# Patient Record
Sex: Female | Born: 1960 | Race: White | Hispanic: No | Marital: Single | Smoking: Former smoker
Health system: Southern US, Community
[De-identification: ages and names within clinical notes are randomized; demographics above are authoritative.]

## PROBLEM LIST (undated history)

## (undated) DIAGNOSIS — Z9889 Other specified postprocedural states: Secondary | ICD-10-CM

## (undated) HISTORY — DX: Other specified postprocedural states: Z98.890

## (undated) HISTORY — PX: OTHER SURGICAL HISTORY: SHX169

---

## 2007-02-22 DIAGNOSIS — Z9889 Other specified postprocedural states: Secondary | ICD-10-CM

## 2007-02-22 HISTORY — DX: Other specified postprocedural states: Z98.890

## 2011-01-18 ENCOUNTER — Ambulatory Visit (INDEPENDENT_AMBULATORY_CARE_PROVIDER_SITE_OTHER): Payer: Managed Care, Other (non HMO) | Admitting: Family Medicine

## 2011-01-18 ENCOUNTER — Encounter: Payer: Self-pay | Admitting: Family Medicine

## 2011-01-18 VITALS — BP 108/71 | HR 89 | Wt 154.0 lb

## 2011-01-18 DIAGNOSIS — Z0000 Encounter for general adult medical examination without abnormal findings: Secondary | ICD-10-CM

## 2011-01-18 DIAGNOSIS — N943 Premenstrual tension syndrome: Secondary | ICD-10-CM

## 2011-01-18 DIAGNOSIS — I8390 Asymptomatic varicose veins of unspecified lower extremity: Secondary | ICD-10-CM

## 2011-01-18 DIAGNOSIS — Z23 Encounter for immunization: Secondary | ICD-10-CM

## 2011-01-18 DIAGNOSIS — F3281 Premenstrual dysphoric disorder: Secondary | ICD-10-CM

## 2011-01-18 DIAGNOSIS — Z1231 Encounter for screening mammogram for malignant neoplasm of breast: Secondary | ICD-10-CM

## 2011-01-18 NOTE — Progress Notes (Signed)
Subjective:    Patient ID: Jody Wheeler, female    DOB: 1960-10-07, 50 y.o.   MRN: [id number]  HPI  Here to estab care.  She recently moved to the area. She is due for her mammogram and would like [place] to schedule that for her. She is also due for Pap smear.  She is seeing Dr Guss Bunde for varicose veins.  Waiting for insurance to approve tx.   He uses sertraline prn for PMDD. Says as she is going throught menopause her need for th emedication has lessened.   Review of Systems  Constitutional: Negative for fever, diaphoresis and unexpected weight change.  HENT: Negative for hearing loss, rhinorrhea and tinnitus.   Eyes: Negative for visual disturbance.  Respiratory: Negative for cough and wheezing.   Cardiovascular: Negative for chest pain and palpitations.  Gastrointestinal: Negative for nausea, vomiting, diarrhea and blood in stool.  Genitourinary: Negative for vaginal bleeding, vaginal discharge and difficulty urinating.  Musculoskeletal: Negative for myalgias and arthralgias.  Skin: Negative for rash.  Neurological: Negative for headaches.  Hematological: Negative for adenopathy. Does not bruise/bleed easily.  Psychiatric/Behavioral: Negative for sleep disturbance and dysphoric mood. The patient is not nervous/anxious.     BP 108/71  Pulse 89  Wt 154 lb (69.854 kg)    No Known Allergies  Past Medical History  Diagnosis Date  . S/P right heart catheterization 2009    Past Surgical History  Procedure Date  . Bunion removal     History   Social History  . Marital Status: Single    Spouse Name: N/A    Number of Children: 0  . 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 Smoking status: Former Smoker    Quit date: 11/17/2005  . Smokeless tobacco: Not on file  . Alcohol Use: 2.0 oz/week    4 drink(s) per week  . Drug Use: No  . Sexually Active: Yes -- Female partner(s)   Other Topics Concern    . Not on file   Social History Narrative   No regular exercise.  3 caffeine drinks per day.     Family History  Problem Relation Age of Onset  . Lung cancer Mother     smoker  . Heart attack Father 20    deceased age 1.     Current outpatient prescriptions:sertraline (ZOLOFT) 25 MG tablet, Take 25 mg by mouth daily.  , Disp: , Rfl:       Objective:   Physical Exam  Constitutional: She is oriented to person, place, and time. She appears well-developed and well-nourished.  HENT:  Head: Normocephalic and atraumatic.  Cardiovascular: Normal rate, regular rhythm and normal heart sounds.   Pulmonary/Chest: Effort normal and breath sounds normal.  Musculoskeletal: She exhibits no edema.  Neurological: She is alert and oriented to person, place, and time.  Skin: Skin is warm and dry.  Psychiatric: She has a normal mood and affect. Her behavior is normal.          Assessment & Plan:  PMDD- doing well on as needed sertraline. We were happy to refill this for her if she needs a refill.  She plans on scheduling a physical with Pap smear in the next one to 2 months. I did go ahead and give her a lab slip to check a CMP and screening cholesterol. This can sometimes she's had this done.  We'll schedule  a mammogram for her.  Flu vaccine given today.

## 2011-01-18 NOTE — Assessment & Plan Note (Signed)
She only uses the sertraline PRN.  Having to use less fruqeuently. On average twice a month.

## 2011-01-18 NOTE — Patient Instructions (Signed)
Schedule your physical with pap smear anytime.

## 2011-01-20 LAB — LIPID PANEL
HDL: 71 mg/dL (ref >39)
Total CHOL/HDL Ratio: 3.4 Ratio
VLDL: 24 mg/dL (ref 0–40)

## 2011-01-21 LAB — COMPLETE METABOLIC PANEL WITH GFR
ALT: 16 U/L (ref 0–35)
AST: 21 U/L (ref 0–37)
Albumin: 4.5 g/dL (ref 3.5–5.2)
Alkaline Phosphatase: 44 U/L (ref 39–117)
GFR, Est Non African American: 84 mL/min
Glucose, Bld: 97 mg/dL (ref 70–99)
Potassium: 4.7 mEq/L (ref 3.5–5.3)
Sodium: 138 mEq/L (ref 135–145)
Total Bilirubin: 0.3 mg/dL (ref 0.3–1.2)
Total Protein: 7.1 g/dL (ref 6.0–8.3)

## 2011-02-23 ENCOUNTER — Encounter: Payer: Managed Care, Other (non HMO) | Admitting: Family Medicine

## 2011-03-02 ENCOUNTER — Other Ambulatory Visit (HOSPITAL_COMMUNITY)
Admission: RE | Admit: 2011-03-02 | Discharge: 2011-03-02 | Disposition: A | Discharge status: Home / Self Care | Payer: Managed Care, Other (non HMO) | Source: Ambulatory Visit | Attending: Family Medicine | Admitting: Family Medicine

## 2011-03-02 ENCOUNTER — Encounter: Payer: Self-pay | Admitting: Family Medicine

## 2011-03-02 ENCOUNTER — Ambulatory Visit (INDEPENDENT_AMBULATORY_CARE_PROVIDER_SITE_OTHER): Payer: Managed Care, Other (non HMO) | Admitting: Family Medicine

## 2011-03-02 VITALS — BP 93/59 | HR 70 | Temp 98.8°F | Ht 68.0 in | Wt 158.0 lb

## 2011-03-02 DIAGNOSIS — Z01419 Encounter for gynecological examination (general) (routine) without abnormal findings: Secondary | ICD-10-CM

## 2011-03-02 DIAGNOSIS — Z1159 Encounter for screening for other viral diseases: Secondary | ICD-10-CM | POA: Insufficient documentation

## 2011-03-02 NOTE — Patient Instructions (Signed)
Start a regular exercise program and make sure you are eating a healthy diet Try to eat 4 servings of dairy a day or take a calcium supplement (500mg twice a day). Your vaccines are up to date.   

## 2011-03-02 NOTE — Progress Notes (Signed)
Subjective:     Jody Wheeler is a 51 y.o. female and is here for a comprehensive physical exam. The patient reports no problems.  History   Social History  . Marital Status: Single    Spouse Name: N/A    Number of Children: 0  . 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 Smoking status: Former Smoker    Quit date: 11/17/2005  . Smokeless tobacco: Not on file  . Alcohol Use: 2.0 oz/week    4 drink(s) per week  . Drug Use: No  . Sexually Active: Yes -- Female partner(s)   Other Topics Concern  . Not on file   Social History Narrative   No regular exercise.  3 caffeine drinks per day.    Health Maintenance  Topic Date Due  . Influenza Vaccine  11/22/2011  . Pap Smear  03/01/2014  . Tetanus/tdap  02/22/2015    The following portions of the patient's history were reviewed and updated as appropriate: allergies, current medications, past family history, past medical history, past social history, past surgical history and problem list.  Review of Systems A comprehensive review of systems was negative.   Objective:     BP 93/59  Pulse 70  Temp(Src) 98.8 F (37.1 C) (Oral)  Ht 5\' 8"  (1.727 m)  Wt 158 lb (71.668 kg)  BMI 24.02 kg/m2  SpO2 100%  LMP 02/22/2011  General Appearance:    Alert, cooperative, no distress, appears stated age  Head:    Normocephalic, without obvious abnormality, atraumatic  Eyes:    PERRL, conjunctiva/corneas clear, EOM's intact both eyes  Ears:    Normal TM's and external ear canals, both ears  Nose:   Nares normal, septum midline, mucosa normal, no drainage    or sinus tenderness  Throat:   Lips, mucosa, and tongue normal; teeth and gums normal, small mobile right anterior cerv LN.   Neck:   Supple, symmetrical, trachea midline, no adenopathy;    thyroid:  no enlargement/tenderness/nodules; no carotid   bruit or JVD  Back:     Symmetric, no curvature, ROM normal, no CVA  tenderness  Lungs:     Clear to auscultation bilaterally, respirations unlabored  Chest Wall:    No tenderness or deformity   Heart:    Regular rate and rhythm, S1 and S2 normal, no murmur, rub   or gallop  Breast Exam:    No tenderness, masses, or nipple abnormality  Abdomen:     Soft, non-tender, bowel sounds active all four quadrants,    no masses, no organomegaly  Genitalia:    Normal female without lesion, discharge or tenderness  Rectal:    Normal tone, normal prostate, no masses or tenderness;   guaiac negative stool  Extremities:   Extremities normal, atraumatic, no cyanosis or edema  Pulses:   2+ and symmetric all extremities  Skin:   Skin color, texture, turgor normal, no rashes or lesions  Lymph nodes:   Cervical, supraclavicular, and axillary nodes normal  Neurologic:   CNII-XII intact, normal strength, sensation and reflexes    throughout      Assessment:    Healthy female exam.     Plan:     See After Visit Summary for Counseling Recommendations  Start a regular exercise program and make sure you are eating a healthy diet Try to eat 4 servings of dairy a day  or take a calcium supplement (500mg  twice a day). Your vaccines are up to date.   Right ant cervical LN. - Keep an eye on this for 1 month. If not resolving reexamine and check CBC  Will schedule mammo. We ordered it in [place] but pt was never called. She will walk down to schedule.    Will call with pap results  Given copy of labs and reviewed. She has started flax seen which has helped her chol in th epast. Recheck lipids later this month. Slip given to go to lab.

## 2011-03-08 ENCOUNTER — Ambulatory Visit
Admission: RE | Admit: 2011-03-08 | Discharge: 2011-03-08 | Disposition: A | Discharge status: Home / Self Care | Payer: Managed Care, Other (non HMO) | Source: Ambulatory Visit | Attending: Family Medicine | Admitting: Family Medicine

## 2011-03-08 DIAGNOSIS — Z1231 Encounter for screening mammogram for malignant neoplasm of breast: Secondary | ICD-10-CM

## 2011-03-14 ENCOUNTER — Telehealth: Payer: Self-pay | Admitting: *Deleted

## 2011-03-14 NOTE — Telephone Encounter (Signed)
Pt was notified about her mammogram

## 2011-03-16 ENCOUNTER — Other Ambulatory Visit: Payer: Self-pay | Admitting: Family Medicine

## 2011-03-16 DIAGNOSIS — R928 Other abnormal and inconclusive findings on diagnostic imaging of breast: Secondary | ICD-10-CM

## 2011-03-24 ENCOUNTER — Ambulatory Visit
Admission: RE | Admit: 2011-03-24 | Discharge: 2011-03-24 | Disposition: A | Discharge status: Home / Self Care | Payer: Managed Care, Other (non HMO) | Source: Ambulatory Visit | Attending: Family Medicine | Admitting: Family Medicine

## 2011-03-24 ENCOUNTER — Other Ambulatory Visit: Payer: Self-pay | Admitting: Family Medicine

## 2011-03-24 DIAGNOSIS — R928 Other abnormal and inconclusive findings on diagnostic imaging of breast: Secondary | ICD-10-CM

## 2011-03-25 ENCOUNTER — Other Ambulatory Visit (HOSPITAL_COMMUNITY): Payer: Self-pay

## 2011-04-13 LAB — LIPID PANEL
Cholesterol: 206 mg/dL — ABNORMAL HIGH (ref 0–200)
LDL Cholesterol: 115 mg/dL — ABNORMAL HIGH (ref 0–99)
Triglycerides: 140 mg/dL (ref <150)
VLDL: 28 mg/dL (ref 0–40)

## 2011-04-26 ENCOUNTER — Ambulatory Visit (INDEPENDENT_AMBULATORY_CARE_PROVIDER_SITE_OTHER): Payer: Managed Care, Other (non HMO) | Admitting: Family Medicine

## 2011-04-26 ENCOUNTER — Encounter: Payer: Self-pay | Admitting: Family Medicine

## 2011-04-26 DIAGNOSIS — K1370 Unspecified lesions of oral mucosa: Secondary | ICD-10-CM

## 2011-04-26 NOTE — Progress Notes (Signed)
  Subjective:    Patient ID: Jody Wheeler, female    DOB: 28-Apr-1960, 51 y.o.   MRN: [id number]  HPI Noticed a blister on the roof of the mouth. No knownn trauma or burns.  No fever. Will occ burst and drain fluids. No very panful.  Today it seem smaller.  No treatments. Says when it drains not sure what it looks like but tastes really bad.  No ST or dental problems. Goes to the dentis twice a year. Last appt was 4 months ago.     Review of Systems     Objective:   Physical Exam  She has a small 0.3 cm white colored vesicle with clear fluid. No surroundin erythema. Nontender.  Teeth  Appear in good shape.       Assessment & Plan:  Bister on the roof of mouth. Vesicle- Gave reassurance. May be burn or trauma induced.  Peroxide/water gargles. Call if not better in one week or if getting worse, fever, or colored drainage.

## 2011-04-26 NOTE — Patient Instructions (Signed)
Hydrogen peroxide and water gargles 4 times a day.  Call if not better in one week.

## 2011-11-25 ENCOUNTER — Ambulatory Visit (INDEPENDENT_AMBULATORY_CARE_PROVIDER_SITE_OTHER): Payer: Managed Care, Other (non HMO) | Admitting: Family Medicine

## 2011-11-25 ENCOUNTER — Encounter: Payer: Self-pay | Admitting: Family Medicine

## 2011-11-25 VITALS — BP 132/85 | HR 72 | Wt 155.0 lb

## 2011-11-25 DIAGNOSIS — N943 Premenstrual tension syndrome: Secondary | ICD-10-CM

## 2011-11-25 DIAGNOSIS — F3281 Premenstrual dysphoric disorder: Secondary | ICD-10-CM

## 2011-11-25 DIAGNOSIS — Z23 Encounter for immunization: Secondary | ICD-10-CM

## 2011-11-25 MED ORDER — SERTRALINE HCL 25 MG PO TABS: 25.0000 mg | ORAL_TABLET | Freq: Every day | ORAL | Status: DC

## 2011-11-25 NOTE — Progress Notes (Signed)
  Subjective:    Patient ID: Jody Wheeler, female    DOB: 15-Aug-1960, 51 y.o.   MRN: [id number]  HPI PMDD - Says cam off the sertraline months ago and now noticing she is irritable and snappy. Still having her period.  She is lseeping well.  She is aking to restart it. No S.E. Denies depression. Sleeping well.    Review of Systems     Objective:   Physical Exam  Constitutional: She is oriented to person, place, and time. She appears well-developed and well-nourished.  HENT:  Head: Normocephalic and atraumatic.  Neck: Neck supple. No thyromegaly present.  Cardiovascular: Normal rate, regular rhythm and normal heart sounds.   Pulmonary/Chest: Effort normal and breath sounds normal.  Lymphadenopathy:    She has no cervical adenopathy.  Neurological: She is alert and oriented to person, place, and time.  Skin: Skin is warm and dry.  Psychiatric: She has a normal mood and affect. Her behavior is normal.          Assessment & Plan:  PMDD- Will restart the sertraline. New prescriptions sent to pharmacy. I think she will do well. She gram-positive before. Call if not working well.  Recommend followup for annual wellness exam in January February next year. She'll be due for for blood work at that time.  Flu vaccine given today.

## 2012-10-20 ENCOUNTER — Other Ambulatory Visit: Payer: Self-pay | Admitting: Family Medicine

## 2013-06-14 ENCOUNTER — Encounter: Payer: Self-pay | Admitting: Family Medicine

## 2013-06-14 ENCOUNTER — Ambulatory Visit (INDEPENDENT_AMBULATORY_CARE_PROVIDER_SITE_OTHER): Payer: BC Managed Care – PPO | Admitting: Family Medicine

## 2013-06-14 VITALS — BP 102/67 | HR 79 | Ht 68.0 in | Wt 161.0 lb

## 2013-06-14 DIAGNOSIS — Z1231 Encounter for screening mammogram for malignant neoplasm of breast: Secondary | ICD-10-CM

## 2013-06-14 DIAGNOSIS — Z0000 Encounter for general adult medical examination without abnormal findings: Secondary | ICD-10-CM

## 2013-06-14 NOTE — Addendum Note (Signed)
Addended by: Deno EtienneBARKLEY, Slaton Reaser L on: 06/14/2013 11:48 AM   Modules accepted: Orders, Medications

## 2013-06-14 NOTE — Progress Notes (Signed)
Subjective:     Jody Wheeler is a [age] y.o. female and is here for a comprehensive physical exam. The patient reports no problems.  Her biggest concern right now is hot flashes. Previously she was able to use black cohosh and soy to control her symptoms. Her last menstrual cycle was in December. Since then her symptoms have been more severe. Hasn't really been affecting her sleep which is good. She wants to know if it's okay to take estroven over-the-counter and also I discussed potentially hormone replacement therapy.  History   Social History  . Marital Status: Single    Spouse Name: N/A    Number of Children: 0  . Years of Education: Assoc deg   Occupational History  . 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 Smoking status: Former Smoker    Quit date: 11/17/2005  . Smokeless tobacco: Not on file  . Alcohol Use: 2.0 oz/week    4 drink(s) per week  . Drug Use: No  . Sexual Activity: Yes    Partners: Male   Other Topics Concern  . Not on file   Social History Narrative   No regular exercise.  3 caffeine drinks per day.    Health Maintenance  Topic Date Due  . Mammogram  05/15/2011  . Colonoscopy  05/15/2011  . Influenza Vaccine  09/21/2013  . Pap Smear  03/01/2014  . Tetanus/tdap  02/22/2015    The following portions of the patient's history were reviewed and updated as appropriate: allergies, current medications, past family history, past medical history, past social history, past surgical history and problem list.  Review of Systems A comprehensive review of systems was negative.   Objective:    There were no vitals taken for this visit. General appearance: alert, cooperative and appears stated age Head: Normocephalic, without obvious abnormality, atraumatic Eyes: conj clear, EOMI, PEERLA Ears: normal TM's and external ear canals both ears Nose: Nares normal. Septum midline. Mucosa normal. No drainage or sinus tenderness. Throat: lips, mucosa, and  tongue normal; teeth and gums normal Neck: no adenopathy, no carotid bruit, no JVD, supple, symmetrical, trachea midline and thyroid not enlarged, symmetric, no tenderness/mass/nodules Back: symmetric, no curvature. ROM normal. No CVA tenderness. Lungs: clear to auscultation bilaterally Breasts: normal appearance, no masses or tenderness Heart: regular rate and rhythm, S1, S2 normal, no murmur, click, rub or gallop Abdomen: soft, non-tender; bowel sounds normal; no masses,  no organomegaly Extremities: extremities normal, atraumatic, no cyanosis or edema Pulses: 2+ and symmetric Skin: Skin color, texture, turgor normal. No rashes or lesions Lymph nodes: Cervical, supraclavicular, and axillary nodes normal. Neurologic: Alert and oriented X 3, normal strength and tone. Normal symmetric reflexes. Normal coordination and gait     Assessment:    Healthy female exam.    Plan:     See After Visit Summary for Counseling Recommendations  complete physical examination Keep up a regular exercise program and make sure you are eating a healthy diet Try to eat 4 servings of dairy a day, or if you are lactose intolerant take a calcium with vitamin D daily.  Your vaccines are up to date.  Due for mammogram.   Dicussed need for colon Cancer screening. Prefers to go ahead and schedule in [place] since she's had a followup ultrasound the last couple of times. Pap smear is UTD.   Hot flashes-okay to use black cohosh and may even want to try adding evening primrose oil. Call  if any problems or concerns. We did discuss the pros and cons of hormone replacement therapy. Certainly she can think about it and let me know what she would like to do. We did discuss increased risk of heart disease as well as breast cancer. She has no family history of breast cancer or personal history.

## 2013-06-21 LAB — LIPID PANEL
CHOL/HDL RATIO: 2.4 ratio
CHOLESTEROL: 159 mg/dL (ref 0–200)
HDL: 65 mg/dL (ref >39)
LDL Cholesterol: 73 mg/dL (ref 0–99)
TRIGLYCERIDES: 107 mg/dL (ref <150)
VLDL: 21 mg/dL (ref 0–40)

## 2013-06-21 LAB — COMPLETE METABOLIC PANEL WITH GFR
ALK PHOS: 52 U/L (ref 39–117)
ALT: 19 U/L (ref 0–35)
AST: 25 U/L (ref 0–37)
Albumin: 4 g/dL (ref 3.5–5.2)
BUN: 16 mg/dL (ref 6–23)
CALCIUM: 8.7 mg/dL (ref 8.4–10.5)
CHLORIDE: 105 meq/L (ref 96–112)
CO2: 24 mEq/L (ref 19–32)
CREATININE: 0.7 mg/dL (ref 0.50–1.10)
GFR, Est African American: >89 mL/min
GFR, Est Non African American: >89 mL/min
Glucose, Bld: 104 mg/dL — ABNORMAL HIGH (ref 70–99)
Potassium: 4.6 mEq/L (ref 3.5–5.3)
Sodium: 139 mEq/L (ref 135–145)
Total Bilirubin: 0.4 mg/dL (ref 0.2–1.2)
Total Protein: 6.3 g/dL (ref 6.0–8.3)

## 2013-06-24 NOTE — Progress Notes (Signed)
Quick Note:  All labs are normal. ______ 

## 2013-10-08 ENCOUNTER — Emergency Department
Admission: EM | Admit: 2013-10-08 | Discharge: 2013-10-08 | Disposition: A | Discharge status: Home / Self Care | Payer: BC Managed Care – PPO | Source: Home / Self Care

## 2013-10-08 ENCOUNTER — Emergency Department (INDEPENDENT_AMBULATORY_CARE_PROVIDER_SITE_OTHER): Payer: BC Managed Care – PPO

## 2013-10-08 ENCOUNTER — Encounter: Payer: Self-pay | Admitting: Emergency Medicine

## 2013-10-08 DIAGNOSIS — M549 Dorsalgia, unspecified: Secondary | ICD-10-CM

## 2013-10-08 DIAGNOSIS — S239XXA Sprain of unspecified parts of thorax, initial encounter: Secondary | ICD-10-CM

## 2013-10-08 DIAGNOSIS — S139XXA Sprain of joints and ligaments of unspecified parts of neck, initial encounter: Secondary | ICD-10-CM

## 2013-10-08 DIAGNOSIS — S335XXA Sprain of ligaments of lumbar spine, initial encounter: Secondary | ICD-10-CM

## 2013-10-08 MED ORDER — KETOROLAC TROMETHAMINE 60 MG/2ML IM SOLN
60.0000 mg | Freq: Once | INTRAMUSCULAR | Status: AC
  Administered 2013-10-08: 60 mg via INTRAMUSCULAR

## 2013-10-08 MED ORDER — CYCLOBENZAPRINE HCL 10 MG PO TABS: ORAL_TABLET | ORAL | Status: DC
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
## 2013-10-08 NOTE — Discharge Instructions (Signed)
Ibuprofen 800mg  up to three times a day.  Flexeril as needed up to 3 times a day.  Alternate heat and ice.  Rest for next 3 days.   Low Back Sprain with Rehab  A sprain is an injury in which a ligament is torn. The ligaments of the lower back are vulnerable to sprains. However, they are strong and require great force to be injured. These ligaments are important for stabilizing the spinal column. Sprains are classified into three categories. Grade 1 sprains cause pain, but the tendon is not lengthened. Grade 2 sprains include a lengthened ligament, due to the ligament being stretched or partially ruptured. With grade 2 sprains there is still function, although the function may be decreased. Grade 3 sprains involve a complete tear of the tendon or muscle, and function is usually impaired. SYMPTOMS   Severe pain in the lower back.  Sometimes, a feeling of a "pop," "snap," or tear, at the time of injury.  Tenderness and sometimes swelling at the injury site.  Uncommonly, bruising (contusion) within 48 hours of injury.  Muscle spasms in the back. CAUSES  Low back sprains occur when a force is placed on the ligaments that is greater than they can handle. Common causes of injury include:  Performing a stressful act while off-balance.  Repetitive stressful activities that involve movement of the lower back.  Direct hit (trauma) to the lower back. RISK INCREASES WITH:  Contact sports (football, wrestling).  Collisions (major skiing accidents).  Sports that require throwing or lifting (baseball, weightlifting).  Sports involving twisting of the spine (gymnastics, diving, tennis, golf).  Poor strength and flexibility.  Inadequate protection.  Previous back injury or surgery (especially fusion). PREVENTION  Wear properly fitted and padded protective equipment.  Warm up and stretch properly before activity.  Allow for adequate recovery between workouts.  Maintain physical  fitness:  Strength, flexibility, and endurance.  Cardiovascular fitness.  Maintain a healthy body weight. PROGNOSIS  If treated properly, low back sprains usually heal with non-surgical treatment. The length of time for healing depends on the severity of the injury.  RELATED COMPLICATIONS   Recurring symptoms, resulting in a chronic problem.  Chronic inflammation and pain in the low back.  Delayed healing or resolution of symptoms, especially if activity is resumed too soon.  Prolonged impairment.  Unstable or arthritic joints of the low back. TREATMENT  Treatment first involves the use of ice and medicine, to reduce pain and inflammation. The use of strengthening and stretching exercises may help reduce pain with activity. These exercises may be performed at home or with a therapist. Severe injuries may require referral to a therapist for further evaluation and treatment, such as ultrasound. Your caregiver may advise that you wear a back brace or corset, to help reduce pain and discomfort. Often, prolonged bed rest results in greater harm then benefit. Corticosteroid injections may be recommended. However, these should be reserved for the most serious cases. It is important to avoid using your back when lifting objects. At night, sleep on your back on a firm mattress, with a pillow placed under your knees. If non-surgical treatment is unsuccessful, surgery may be needed.  MEDICATION   If pain medicine is needed, nonsteroidal anti-inflammatory medicines (aspirin and ibuprofen), or other minor pain relievers (acetaminophen), are often advised.  Do not take pain medicine for 7 days before surgery.  Prescription pain relievers may be given, if your caregiver thinks they are needed. Use only as directed and only as much  as you need.  Ointments applied to the skin may be helpful.  Corticosteroid injections may be given by your caregiver. These injections should be reserved for the most  serious cases, because they may only be given a certain number of times. HEAT AND COLD  Cold treatment (icing) should be applied for 10 to 15 minutes every 2 to 3 hours for inflammation and pain, and immediately after activity that aggravates your symptoms. Use ice packs or an ice massage.  Heat treatment may be used before performing stretching and strengthening activities prescribed by your caregiver, physical therapist, or athletic trainer. Use a heat pack or a warm water soak. SEEK MEDICAL CARE IF:   Symptoms get worse or do not improve in 2 to 4 weeks, despite treatment.  You develop numbness or weakness in either leg.  You lose bowel or bladder function.  Any of the following occur after surgery: fever, increased pain, swelling, redness, drainage of fluids, or bleeding in the affected area.  New, unexplained symptoms develop. (Drugs used in treatment may produce side effects.) EXERCISES  RANGE OF MOTION (ROM) AND STRETCHING EXERCISES - Low Back Sprain Most people with lower back pain will find that their symptoms get worse with excessive bending forward (flexion) or arching at the lower back (extension). The exercises that will help resolve your symptoms will focus on the opposite motion.  Your physician, physical therapist or athletic trainer will help you determine which exercises will be most helpful to resolve your lower back pain. Do not complete any exercises without first consulting with your caregiver. Discontinue any exercises which make your symptoms worse, until you speak to your caregiver. If you have pain, numbness or tingling which travels down into your buttocks, leg or foot, the goal of the therapy is for these symptoms to move closer to your back and eventually resolve. Sometimes, these leg symptoms will get better, but your lower back pain may worsen. This is often an indication of progress in your rehabilitation. Be very alert to any changes in your symptoms and the  activities in which you participated in the 24 hours prior to the change. Sharing this information with your caregiver will allow him or her to most efficiently treat your condition. These exercises may help you when beginning to rehabilitate your injury. Your symptoms may resolve with or without further involvement from your physician, physical therapist or athletic trainer. While completing these exercises, remember:   Restoring tissue flexibility helps normal motion to return to the joints. This allows healthier, less painful movement and activity.  An effective stretch should be held for at least 30 seconds.  A stretch should never be painful. You should only feel a gentle lengthening or release in the stretched tissue. FLEXION RANGE OF MOTION AND STRETCHING EXERCISES: STRETCH - Flexion, Single Knee to Chest   Lie on a firm bed or floor with both legs extended in front of you.  Keeping one leg in contact with the floor, bring your opposite knee to your chest. Hold your leg in place by either grabbing behind your thigh or at your knee.  Pull until you feel a gentle stretch in your low back. Hold __________ seconds.  Slowly release your grasp and repeat the exercise with the opposite side. Repeat __________ times. Complete this exercise __________ times per day.  STRETCH - Flexion, Double Knee to Chest  Lie on a firm bed or floor with both legs extended in front of you.  Keeping one leg in contact  with the floor, bring your opposite knee to your chest.  Tense your stomach muscles to support your back and then lift your other knee to your chest. Hold your legs in place by either grabbing behind your thighs or at your knees.  Pull both knees toward your chest until you feel a gentle stretch in your low back. Hold __________ seconds.  Tense your stomach muscles and slowly return one leg at a time to the floor. Repeat __________ times. Complete this exercise __________ times per day.    STRETCH - Low Trunk Rotation  Lie on a firm bed or floor. Keeping your legs in front of you, bend your knees so they are both pointed toward the ceiling and your feet are flat on the floor.  Extend your arms out to the side. This will stabilize your upper body by keeping your shoulders in contact with the floor.  Gently and slowly drop both knees together to one side until you feel a gentle stretch in your low back. Hold for __________ seconds.  Tense your stomach muscles to support your lower back as you bring your knees back to the starting position. Repeat the exercise to the other side. Repeat __________ times. Complete this exercise __________ times per day  EXTENSION RANGE OF MOTION AND FLEXIBILITY EXERCISES: STRETCH - Extension, Prone on Elbows   Lie on your stomach on the floor, a bed will be too soft. Place your palms about shoulder width apart and at the height of your head.  Place your elbows under your shoulders. If this is too painful, stack pillows under your chest.  Allow your body to relax so that your hips drop lower and make contact more completely with the floor.  Hold this position for __________ seconds.  Slowly return to lying flat on the floor. Repeat __________ times. Complete this exercise __________ times per day.  RANGE OF MOTION - Extension, Prone Press Ups  Lie on your stomach on the floor, a bed will be too soft. Place your palms about shoulder width apart and at the height of your head.  Keeping your back as relaxed as possible, slowly straighten your elbows while keeping your hips on the floor. You may adjust the placement of your hands to maximize your comfort. As you gain motion, your hands will come more underneath your shoulders.  Hold this position __________ seconds.  Slowly return to lying flat on the floor. Repeat __________ times. Complete this exercise __________ times per day.  RANGE OF MOTION- Quadruped, Neutral Spine   Assume a hands  and knees position on a firm surface. Keep your hands under your shoulders and your knees under your hips. You may place padding under your knees for comfort.  Drop your head and point your tailbone toward the ground below you. This will round out your lower back like an angry cat. Hold this position for __________ seconds.  Slowly lift your head and release your tail bone so that your back sags into a large arch, like an old horse.  Hold this position for __________ seconds.  Repeat this until you feel limber in your low back.  Now, find your "sweet spot." This will be the most comfortable position somewhere between the two previous positions. This is your neutral spine. Once you have found this position, tense your stomach muscles to support your low back.  Hold this position for __________ seconds. Repeat __________ times. Complete this exercise __________ times per day.  STRENGTHENING EXERCISES - Low  Back Sprain These exercises may help you when beginning to rehabilitate your injury. These exercises should be done near your "sweet spot." This is the neutral, low-back arch, somewhere between fully rounded and fully arched, that is your least painful position. When performed in this safe range of motion, these exercises can be used for people who have either a flexion or extension based injury. These exercises may resolve your symptoms with or without further involvement from your physician, physical therapist or athletic trainer. While completing these exercises, remember:   Muscles can gain both the endurance and the strength needed for everyday activities through controlled exercises.  Complete these exercises as instructed by your physician, physical therapist or athletic trainer. Increase the resistance and repetitions only as guided.  You may experience muscle soreness or fatigue, but the pain or discomfort you are trying to eliminate should never worsen during these exercises. If this  pain does worsen, stop and make certain you are following the directions exactly. If the pain is still present after adjustments, discontinue the exercise until you can discuss the trouble with your caregiver. STRENGTHENING - Deep Abdominals, Pelvic Tilt   Lie on a firm bed or floor. Keeping your legs in front of you, bend your knees so they are both pointed toward the ceiling and your feet are flat on the floor.  Tense your lower abdominal muscles to press your low back into the floor. This motion will rotate your pelvis so that your tail bone is scooping upwards rather than pointing at your feet or into the floor. With a gentle tension and even breathing, hold this position for __________ seconds. Repeat __________ times. Complete this exercise __________ times per day.  STRENGTHENING - Abdominals, Crunches   Lie on a firm bed or floor. Keeping your legs in front of you, bend your knees so they are both pointed toward the ceiling and your feet are flat on the floor. Cross your arms over your chest.  Slightly tip your chin down without bending your neck.  Tense your abdominals and slowly lift your trunk high enough to just clear your shoulder blades. Lifting higher can put excessive stress on the lower back and does not further strengthen your abdominal muscles.  Control your return to the starting position. Repeat __________ times. Complete this exercise __________ times per day.  STRENGTHENING - Quadruped, Opposite UE/LE Lift   Assume a hands and knees position on a firm surface. Keep your hands under your shoulders and your knees under your hips. You may place padding under your knees for comfort.  Find your neutral spine and gently tense your abdominal muscles so that you can maintain this position. Your shoulders and hips should form a rectangle that is parallel with the floor and is not twisted.  Keeping your trunk steady, lift your right hand no higher than your shoulder and then your  left leg no higher than your hip. Make sure you are not holding your breath. Hold this position for __________ seconds.  Continuing to keep your abdominal muscles tense and your back steady, slowly return to your starting position. Repeat with the opposite arm and leg. Repeat __________ times. Complete this exercise __________ times per day.  STRENGTHENING - Abdominals and Quadriceps, Straight Leg Raise   Lie on a firm bed or floor with both legs extended in front of you.  Keeping one leg in contact with the floor, bend the other knee so that your foot can rest flat on the floor.  Find your neutral spine, and tense your abdominal muscles to maintain your spinal position throughout the exercise.  Slowly lift your straight leg off the floor about 6 inches for a count of 15, making sure to not hold your breath.  Still keeping your neutral spine, slowly lower your leg all the way to the floor. Repeat this exercise with each leg __________ times. Complete this exercise __________ times per day. POSTURE AND BODY MECHANICS CONSIDERATIONS - Low Back Sprain Keeping correct posture when sitting, standing or completing your activities will reduce the stress put on different body tissues, allowing injured tissues a chance to heal and limiting painful experiences. The following are general guidelines for improved posture. Your physician or physical therapist will provide you with any instructions specific to your needs. While reading these guidelines, remember:  The exercises prescribed by your provider will help you have the flexibility and strength to maintain correct postures.  The correct posture provides the best environment for your joints to work. All of your joints have less wear and tear when properly supported by a spine with good posture. This means you will experience a healthier, less painful body.  Correct posture must be practiced with all of your activities, especially prolonged sitting and  standing. Correct posture is as important when doing repetitive low-stress activities (typing) as it is when doing a single heavy-load activity (lifting). RESTING POSITIONS Consider which positions are most painful for you when choosing a resting position. If you have pain with flexion-based activities (sitting, bending, stooping, squatting), choose a position that allows you to rest in a less flexed posture. You would want to avoid curling into a fetal position on your side. If your pain worsens with extension-based activities (prolonged standing, working overhead), avoid resting in an extended position such as sleeping on your stomach. Most people will find more comfort when they rest with their spine in a more neutral position, neither too rounded nor too arched. Lying on a non-sagging bed on your side with a pillow between your knees, or on your back with a pillow under your knees will often provide some relief. Keep in mind, being in any one position for a prolonged period of time, no matter how correct your posture, can still lead to stiffness. PROPER SITTING POSTURE In order to minimize stress and discomfort on your spine, you must sit with correct posture. Sitting with good posture should be effortless for a healthy body. Returning to good posture is a gradual process. Many people can work toward this most comfortably by using various supports until they have the flexibility and strength to maintain this posture on their own. When sitting with proper posture, your ears will fall over your shoulders and your shoulders will fall over your hips. You should use the back of the chair to support your upper back. Your lower back will be in a neutral position, just slightly arched. You may place a small pillow or folded towel at the base of your lower back for  support.  When working at a desk, create an environment that supports good, upright posture. Without extra support, muscles tire, which leads to  excessive strain on joints and other tissues. Keep these recommendations in mind: CHAIR:  A chair should be able to slide under your desk when your back makes contact with the back of the chair. This allows you to work closely.  The chair's height should allow your eyes to be level with the upper part of your monitor and  your hands to be slightly lower than your elbows. BODY POSITION  Your feet should make contact with the floor. If this is not possible, use a foot rest.  Keep your ears over your shoulders. This will reduce stress on your neck and low back. INCORRECT SITTING POSTURES  If you are feeling tired and unable to assume a healthy sitting posture, do not slouch or slump. This puts excessive strain on your back tissues, causing more damage and pain. Healthier options include:  Using more support, like a lumbar pillow.  Switching tasks to something that requires you to be upright or walking.  Talking a brief walk.  Lying down to rest in a neutral-spine position. PROLONGED STANDING WHILE SLIGHTLY LEANING FORWARD  When completing a task that requires you to lean forward while standing in one place for a long time, place either foot up on a stationary 2-4 inch high object to help maintain the best posture. When both feet are on the ground, the lower back tends to lose its slight inward curve. If this curve flattens (or becomes too large), then the back and your other joints will experience too much stress, tire more quickly, and can cause pain. CORRECT STANDING POSTURES Proper standing posture should be assumed with all daily activities, even if they only take a few moments, like when brushing your teeth. As in sitting, your ears should fall over your shoulders and your shoulders should fall over your hips. You should keep a slight tension in your abdominal muscles to brace your spine. Your tailbone should point down to the ground, not behind your body, resulting in an over-extended  swayback posture.  INCORRECT STANDING POSTURES  Common incorrect standing postures include a forward head, locked knees and/or an excessive swayback. WALKING Walk with an upright posture. Your ears, shoulders and hips should all line-up. PROLONGED ACTIVITY IN A FLEXED POSITION When completing a task that requires you to bend forward at your waist or lean over a low surface, try to find a way to stabilize 3 out of 4 of your limbs. You can place a hand or elbow on your thigh or rest a knee on the surface you are reaching across. This will provide you more stability, so that your muscles do not tire as quickly. By keeping your knees relaxed, or slightly bent, you will also reduce stress across your lower back. CORRECT LIFTING TECHNIQUES DO :  Assume a wide stance. This will provide you more stability and the opportunity to get as close as possible to the object which you are lifting.  Tense your abdominals to brace your spine. Bend at the knees and hips. Keeping your back locked in a neutral-spine position, lift using your leg muscles. Lift with your legs, keeping your back straight.  Test the weight of unknown objects before attempting to lift them.  Try to keep your elbows locked down at your sides in order get the best strength from your shoulders when carrying an object.  Always ask for help when lifting heavy or awkward objects. INCORRECT LIFTING TECHNIQUES DO NOT:   Lock your knees when lifting, even if it is a small object.  Bend and twist. Pivot at your feet or move your feet when needing to change directions.  Assume that you can safely pick up even a paperclip without proper posture. Document Released: 02/07/2005 Document Revised: 05/02/2011 Document Reviewed: 05/22/2008 [facility] Patient Information 2015 [place], [place]. This information is not intended to replace advice given to you by your  health care provider. Make sure you discuss any questions you have with your health care  provider. Cervical Strain and Sprain (Whiplash) with Rehab Cervical strain and sprain are injuries that commonly occur with "whiplash" injuries. Whiplash occurs when the neck is forcefully whipped backward or forward, such as during a motor vehicle accident or during contact sports. The muscles, ligaments, tendons, discs, and nerves of the neck are susceptible to injury when this occurs. RISK FACTORS Risk of having a whiplash injury increases if:  Osteoarthritis of the spine.  Situations that make head or neck accidents or trauma more likely.  High-risk sports (football, rugby, wrestling, hockey, auto racing, gymnastics, diving, contact karate, or boxing).  Poor strength and flexibility of the neck.  Previous neck injury.  Poor tackling technique.  Improperly fitted or padded equipment. SYMPTOMS   Pain or stiffness in the front or back of neck or both.  Symptoms may present immediately or up to 24 hours after injury.  Dizziness, headache, nausea, and vomiting.  Muscle spasm with soreness and stiffness in the neck.  Tenderness and swelling at the injury site. PREVENTION  Learn and use proper technique (avoid tackling with the head, spearing, and head-butting; use proper falling techniques to avoid landing on the head).  Warm up and stretch properly before activity.  Maintain physical fitness:  Strength, flexibility, and endurance.  Cardiovascular fitness.  Wear properly fitted and padded protective equipment, such as padded soft collars, for participation in contact sports. PROGNOSIS  Recovery from cervical strain and sprain injuries is dependent on the extent of the injury. These injuries are usually curable in 1 week to 3 months with appropriate treatment.  RELATED COMPLICATIONS   Temporary numbness and weakness may occur if the nerve roots are damaged, and this may persist until the nerve has completely healed.  Chronic pain due to frequent recurrence of  symptoms.  Prolonged healing, especially if activity is resumed too soon (before complete recovery). TREATMENT  Treatment initially involves the use of ice and medication to help reduce pain and inflammation. It is also important to perform strengthening and stretching exercises and modify activities that worsen symptoms so the injury does not get worse. These exercises may be performed at home or with a therapist. For patients who experience severe symptoms, a soft, padded collar may be recommended to be worn around the neck.  Improving your posture may help reduce symptoms. Posture improvement includes pulling your chin and abdomen in while sitting or standing. If you are sitting, sit in a firm chair with your buttocks against the back of the chair. While sleeping, try replacing your pillow with a small towel rolled to 2 inches in diameter, or use a cervical pillow or soft cervical collar. Poor sleeping positions delay healing.  For patients with nerve root damage, which causes numbness or weakness, the use of a cervical traction apparatus may be recommended. Surgery is rarely necessary for these injuries. However, cervical strain and sprains that are present at birth (congenital) may require surgery. MEDICATION   If pain medication is necessary, nonsteroidal anti-inflammatory medications, such as aspirin and ibuprofen, or other minor pain relievers, such as acetaminophen, are often recommended.  Do not take pain medication for 7 days before surgery.  Prescription pain relievers may be given if deemed necessary by your caregiver. Use only as directed and only as much as you need. HEAT AND COLD:   Cold treatment (icing) relieves pain and reduces inflammation. Cold treatment should be applied for 10 to 15  minutes every 2 to 3 hours for inflammation and pain and immediately after any activity that aggravates your symptoms. Use ice packs or an ice massage.  Heat treatment may be used prior to  performing the stretching and strengthening activities prescribed by your caregiver, physical therapist, or athletic trainer. Use a heat pack or a warm soak. SEEK MEDICAL CARE IF:   Symptoms get worse or do not improve in 2 weeks despite treatment.  New, unexplained symptoms develop (drugs used in treatment may produce side effects). EXERCISES RANGE OF MOTION (ROM) AND STRETCHING EXERCISES - Cervical Strain and Sprain These exercises may help you when beginning to rehabilitate your injury. In order to successfully resolve your symptoms, you must improve your posture. These exercises are designed to help reduce the forward-head and rounded-shoulder posture which contributes to this condition. Your symptoms may resolve with or without further involvement from your physician, physical therapist or athletic trainer. While completing these exercises, remember:   Restoring tissue flexibility helps normal motion to return to the joints. This allows healthier, less painful movement and activity.  An effective stretch should be held for at least 20 seconds, although you may need to begin with shorter hold times for comfort.  A stretch should never be painful. You should only feel a gentle lengthening or release in the stretched tissue. STRETCH- Axial Extensors  Lie on your back on the floor. You may bend your knees for comfort. Place a rolled-up hand towel or dish towel, about 2 inches in diameter, under the part of your head that makes contact with the floor.  Gently tuck your chin, as if trying to make a "double chin," until you feel a gentle stretch at the base of your head.  Hold __________ seconds. Repeat __________ times. Complete this exercise __________ times per day.  STRETCH - Axial Extension   Stand or sit on a firm surface. Assume a good posture: chest up, shoulders drawn back, abdominal muscles slightly tense, knees unlocked (if standing) and feet hip width apart.  Slowly retract your  chin so your head slides back and your chin slightly lowers. Continue to look straight ahead.  You should feel a gentle stretch in the back of your head. Be certain not to feel an aggressive stretch since this can cause headaches later.  Hold for __________ seconds. Repeat __________ times. Complete this exercise __________ times per day. STRETCH - Cervical Side Bend   Stand or sit on a firm surface. Assume a good posture: chest up, shoulders drawn back, abdominal muscles slightly tense, knees unlocked (if standing) and feet hip width apart.  Without letting your nose or shoulders move, slowly tip your right / left ear to your shoulder until your feel a gentle stretch in the muscles on the opposite side of your neck.  Hold __________ seconds. Repeat __________ times. Complete this exercise __________ times per day. STRETCH - Cervical Rotators   Stand or sit on a firm surface. Assume a good posture: chest up, shoulders drawn back, abdominal muscles slightly tense, knees unlocked (if standing) and feet hip width apart.  Keeping your eyes level with the ground, slowly turn your head until you feel a gentle stretch along the back and opposite side of your neck.  Hold __________ seconds. Repeat __________ times. Complete this exercise __________ times per day. RANGE OF MOTION - Neck Circles   Stand or sit on a firm surface. Assume a good posture: chest up, shoulders drawn back, abdominal muscles slightly tense, knees unlocked (  if standing) and feet hip width apart.  Gently roll your head down and around from the back of one shoulder to the back of the other. The motion should never be forced or painful.  Repeat the motion 10-20 times, or until you feel the neck muscles relax and loosen. Repeat __________ times. Complete the exercise __________ times per day. STRENGTHENING EXERCISES - Cervical Strain and Sprain These exercises may help you when beginning to rehabilitate your injury. They may  resolve your symptoms with or without further involvement from your physician, physical therapist, or athletic trainer. While completing these exercises, remember:   Muscles can gain both the endurance and the strength needed for everyday activities through controlled exercises.  Complete these exercises as instructed by your physician, physical therapist, or athletic trainer. Progress the resistance and repetitions only as guided.  You may experience muscle soreness or fatigue, but the pain or discomfort you are trying to eliminate should never worsen during these exercises. If this pain does worsen, stop and make certain you are following the directions exactly. If the pain is still present after adjustments, discontinue the exercise until you can discuss the trouble with your clinician. STRENGTH - Cervical Flexors, Isometric  Face a wall, standing about 6 inches away. Place a small pillow, a ball about 6-8 inches in diameter, or a folded towel between your forehead and the wall.  Slightly tuck your chin and gently push your forehead into the soft object. Push only with mild to moderate intensity, building up tension gradually. Keep your jaw and forehead relaxed.  Hold 10 to 20 seconds. Keep your breathing relaxed.  Release the tension slowly. Relax your neck muscles completely before you start the next repetition. Repeat __________ times. Complete this exercise __________ times per day. STRENGTH- Cervical Lateral Flexors, Isometric   Stand about 6 inches away from a wall. Place a small pillow, a ball about 6-8 inches in diameter, or a folded towel between the side of your head and the wall.  Slightly tuck your chin and gently tilt your head into the soft object. Push only with mild to moderate intensity, building up tension gradually. Keep your jaw and forehead relaxed.  Hold 10 to 20 seconds. Keep your breathing relaxed.  Release the tension slowly. Relax your neck muscles completely  before you start the next repetition. Repeat __________ times. Complete this exercise __________ times per day. STRENGTH - Cervical Extensors, Isometric   Stand about 6 inches away from a wall. Place a small pillow, a ball about 6-8 inches in diameter, or a folded towel between the back of your head and the wall.  Slightly tuck your chin and gently tilt your head back into the soft object. Push only with mild to moderate intensity, building up tension gradually. Keep your jaw and forehead relaxed.  Hold 10 to 20 seconds. Keep your breathing relaxed.  Release the tension slowly. Relax your neck muscles completely before you start the next repetition. Repeat __________ times. Complete this exercise __________ times per day. POSTURE AND BODY MECHANICS CONSIDERATIONS - Cervical Strain and Sprain Keeping correct posture when sitting, standing or completing your activities will reduce the stress put on different body tissues, allowing injured tissues a chance to heal and limiting painful experiences. The following are general guidelines for improved posture. Your physician or physical therapist will provide you with any instructions specific to your needs. While reading these guidelines, remember:  The exercises prescribed by your provider will help you have the flexibility and  strength to maintain correct postures.  The correct posture provides the optimal environment for your joints to work. All of your joints have less wear and tear when properly supported by a spine with good posture. This means you will experience a healthier, less painful body.  Correct posture must be practiced with all of your activities, especially prolonged sitting and standing. Correct posture is as important when doing repetitive low-stress activities (typing) as it is when doing a single heavy-load activity (lifting). PROLONGED STANDING WHILE SLIGHTLY LEANING FORWARD When completing a task that requires you to lean  forward while standing in one place for a long time, place either foot up on a stationary 2- to 4-inch high object to help maintain the best posture. When both feet are on the ground, the low back tends to lose its slight inward curve. If this curve flattens (or becomes too large), then the back and your other joints will experience too much stress, fatigue more quickly, and can cause pain.  RESTING POSITIONS Consider which positions are most painful for you when choosing a resting position. If you have pain with flexion-based activities (sitting, bending, stooping, squatting), choose a position that allows you to rest in a less flexed posture. You would want to avoid curling into a fetal position on your side. If your pain worsens with extension-based activities (prolonged standing, working overhead), avoid resting in an extended position such as sleeping on your stomach. Most people will find more comfort when they rest with their spine in a more neutral position, neither too rounded nor too arched. Lying on a non-sagging bed on your side with a pillow between your knees, or on your back with a pillow under your knees will often provide some relief. Keep in mind, being in any one position for a prolonged period of time, no matter how correct your posture, can still lead to stiffness. WALKING Walk with an upright posture. Your ears, shoulders, and hips should all line up. OFFICE WORK When working at a desk, create an environment that supports good, upright posture. Without extra support, muscles fatigue and lead to excessive strain on joints and other tissues. CHAIR:  A chair should be able to slide under your desk when your back makes contact with the back of the chair. This allows you to work closely.  The chair's height should allow your eyes to be level with the upper part of your monitor and your hands to be slightly lower than your elbows.  Body position:  Your feet should make contact with the  floor. If this is not possible, use a foot rest.  Keep your ears over your shoulders. This will reduce stress on your neck and low back. Document Released: 02/07/2005 Document Revised: 06/24/2013 Document Reviewed: 05/22/2008 [facility] Patient Information 2015 [place], [place]. This information is not intended to replace advice given to you by your health care provider. Make sure you discuss any questions you have with your health care provider.
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
## 2013-10-10 NOTE — ED Provider Notes (Signed)
Agree with exam, assessment, and plan.   Lattie HawStephen A Beese, MD 10/10/13 [phone]

## 2013-10-18 ENCOUNTER — Telehealth: Payer: Self-pay | Admitting: *Deleted

## 2013-10-18 NOTE — Telephone Encounter (Signed)
Pt called about stool cards and colonoscopy referral lvm for return call.Loralee Pacas [place]

## 2013-11-20 ENCOUNTER — Ambulatory Visit (INDEPENDENT_AMBULATORY_CARE_PROVIDER_SITE_OTHER): Payer: BC Managed Care – PPO | Admitting: Family Medicine

## 2013-11-20 ENCOUNTER — Encounter: Payer: Self-pay | Admitting: Family Medicine

## 2013-11-20 VITALS — BP 115/77 | HR 75 | Temp 98.2°F | Ht 68.0 in | Wt 162.0 lb

## 2013-11-20 DIAGNOSIS — M5441 Lumbago with sciatica, right side: Secondary | ICD-10-CM

## 2013-11-20 DIAGNOSIS — M5430 Sciatica, unspecified side: Secondary | ICD-10-CM | POA: Diagnosis not present

## 2013-11-20 DIAGNOSIS — Z1211 Encounter for screening for malignant neoplasm of colon: Secondary | ICD-10-CM

## 2013-11-20 MED ORDER — PREDNISONE 20 MG PO TABS: 40.0000 mg | ORAL_TABLET | Freq: Every day | ORAL | Status: DC

## 2013-11-20 MED ORDER — CYCLOBENZAPRINE HCL 10 MG PO TABS: 5.0000 mg | ORAL_TABLET | Freq: Two times a day (BID) | ORAL | Status: DC | PRN
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
## 2013-11-20 NOTE — Patient Instructions (Signed)
Start doing her stretches again. Take your time and be gentle with them. Progresses you're feeling better. Try cutting the muscle relaxer and half since it was fairly sedating before. I do want you to do some walking for 5-10 minutes at a time. Use a heating pad for 10-15 minutes 2-3 times a day If you're not feeling significantly better in the next 2 weeks and please give us a call back.

## 2013-11-27 ENCOUNTER — Ambulatory Visit (INDEPENDENT_AMBULATORY_CARE_PROVIDER_SITE_OTHER): Payer: BC Managed Care – PPO

## 2013-11-27 DIAGNOSIS — Z1231 Encounter for screening mammogram for malignant neoplasm of breast: Secondary | ICD-10-CM

## 2013-12-11 ENCOUNTER — Encounter: Payer: Self-pay | Admitting: Family Medicine

## 2013-12-11 DIAGNOSIS — K648 Other hemorrhoids: Secondary | ICD-10-CM | POA: Insufficient documentation

## 2013-12-11 DIAGNOSIS — K635 Polyp of colon: Secondary | ICD-10-CM | POA: Insufficient documentation

## 2014-06-17 ENCOUNTER — Ambulatory Visit (INDEPENDENT_AMBULATORY_CARE_PROVIDER_SITE_OTHER): Payer: BLUE CROSS/BLUE SHIELD

## 2014-06-17 ENCOUNTER — Ambulatory Visit (INDEPENDENT_AMBULATORY_CARE_PROVIDER_SITE_OTHER): Payer: BLUE CROSS/BLUE SHIELD | Admitting: Family Medicine

## 2014-06-17 ENCOUNTER — Encounter: Payer: Self-pay | Admitting: Family Medicine

## 2014-06-17 VITALS — BP 146/93 | HR 72 | Wt 162.0 lb

## 2014-06-17 DIAGNOSIS — R0602 Shortness of breath: Secondary | ICD-10-CM

## 2014-06-17 DIAGNOSIS — R0789 Other chest pain: Secondary | ICD-10-CM | POA: Diagnosis not present

## 2014-06-17 DIAGNOSIS — R5383 Other fatigue: Secondary | ICD-10-CM | POA: Diagnosis not present

## 2014-06-17 DIAGNOSIS — R079 Chest pain, unspecified: Secondary | ICD-10-CM

## 2014-06-17 LAB — CBC WITH DIFFERENTIAL/PLATELET
BASOS ABS: 0.1 10*3/uL (ref 0.0–0.1)
Basophils Relative: 1 % (ref 0–1)
EOS ABS: 0.1 10*3/uL (ref 0.0–0.7)
EOS PCT: 1 % (ref 0–5)
HCT: 41.5 % (ref 36.0–46.0)
Hemoglobin: 13.9 g/dL (ref 12.0–15.0)
LYMPHS PCT: 31 % (ref 12–46)
Lymphs Abs: 1.8 10*3/uL (ref 0.7–4.0)
MCH: 30.4 pg (ref 26.0–34.0)
MCHC: 33.5 g/dL (ref 30.0–36.0)
MCV: 90.8 fL (ref 78.0–100.0)
MONOS PCT: 7 % (ref 3–12)
Monocytes Absolute: 0.4 10*3/uL (ref 0.1–1.0)
NEUTROS PCT: 60 % (ref 43–77)
Neutro Abs: 3.4 10*3/uL (ref 1.7–7.7)
PLATELETS: 354 10*3/uL (ref 150–400)
RBC: 4.57 MIL/uL (ref 3.87–5.11)
RDW: 12.8 % (ref 11.5–15.5)
WBC: 5.7 10*3/uL (ref 4.0–10.5)

## 2014-06-17 LAB — D-DIMER, QUANTITATIVE: D-Dimer, Quant: 0.37 ug/mL-FEU (ref 0.00–0.48)

## 2014-06-17 LAB — COMPLETE METABOLIC PANEL WITH GFR
ALBUMIN: 4.7 g/dL (ref 3.5–5.2)
ALT: 21 U/L (ref 0–35)
AST: 21 U/L (ref 0–37)
Alkaline Phosphatase: 79 U/L (ref 39–117)
BUN: 15 mg/dL (ref 6–23)
CO2: 23 mEq/L (ref 19–32)
Calcium: 9.3 mg/dL (ref 8.4–10.5)
Chloride: 99 mEq/L (ref 96–112)
Creat: 0.77 mg/dL (ref 0.50–1.10)
GFR, EST NON AFRICAN AMERICAN: 88 mL/min
Glucose, Bld: 105 mg/dL — ABNORMAL HIGH (ref 70–99)
Potassium: 4.6 mEq/L (ref 3.5–5.3)
SODIUM: 139 meq/L (ref 135–145)
Total Bilirubin: 0.3 mg/dL (ref 0.2–1.2)
Total Protein: 7.4 g/dL (ref 6.0–8.3)

## 2014-06-17 LAB — CK: CK TOTAL: 76 U/L (ref 7–177)

## 2014-06-17 LAB — TSH: TSH: 1.156 u[IU]/mL (ref 0.350–4.500)

## 2014-06-17 LAB — TROPONIN I

## 2014-06-17 NOTE — Progress Notes (Signed)
Subjective:    Patient ID: Jody Wheeler, female    DOB: [birth year], 54 y.o.   MRN: [id number]  HPI Patient comes in today complaining of atypical chest pain. [facility] [place], approximately 7 days ago. She had a cough with chest discomfort that started around April 16. They diagnosed her with an upper respiratory infection. And told her to follow-up if she did not improve. She is concerned about her heart because her father MI at age 53. Her mother had lung cancer. She has been very sOB with activity.  Feels like a pressure on lower end of sternum. Has felt a little lighthheaded.  Left hand had been tingling  She is a previous smoker. No ear pain or sore throat. She has had a lot of postnasal drip. No significant nasal congestion.  She also brought in some lab work done recently through her job. In includes a cholesterol panel and complete metabolic panel. Noted, was that her serum glucose was 104. Total cholesterol was 208 and LDL cholesterol was 110.   Review of Systems  BP 146/93 mmHg  Pulse 72  Wt 162 lb (73.483 kg)  SpO2 99%    Allergies  Allergen Reactions  . Ivp Dye [Iodinated Diagnostic Agents]     Past Medical History  Diagnosis Date  . S/P right heart catheterization 2009    Past Surgical History  Procedure Laterality Date  . Bunion removal      History   Social History  . Marital Status: Single    Spouse Name: N/A  . Number of Children: 0  . Years of Education: Assoc deg   Occupational History  . 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 Smoking status: Former Smoker    Quit date: 11/17/2005  . Smokeless tobacco: Not on file  . Alcohol Use: 2.0 oz/week    4 drink(s) per week  . Drug Use: No  . Sexual Activity:    Partners: Male   Other Topics Concern  . Not on file   Social History Narrative   No regular exercise.  3 caffeine drinks per day.     Family History  Problem Relation  Age of Onset  . Lung cancer Mother     smoker  . Heart attack Father [id number]    deceased age 53.     Outpatient Encounter Prescriptions as of 06/17/2014  Medication Sig  . BL EVENING PRIMROSE OIL PO Take by mouth.  Marland Kitchen. BLACK COHOSH PO Take by mouth.  . Calcium Carb-Vit D-Soy Isoflav (SOY FORMULA PO) Take by mouth.  . Calcium Carbonate-Vit D-Min (CALCIUM 1200 PO) Take by mouth.  . cyclobenzaprine (FLEXERIL) 10 MG tablet Take 0.5-1 tablets (5-10 mg total) by mouth 2 (two) times daily as needed for muscle spasms.  . Multiple Vitamins-Minerals (MULTIVITAMIN PO) Take by mouth.  . TURMERIC PO Take by mouth.  . [DISCONTINUED] predniSONE (DELTASONE) 20 MG tablet Take 2 tablets (40 mg total) by mouth daily.          Objective:   Physical Exam  Constitutional: She is oriented to person, place, and time. She appears well-developed and well-nourished.  HENT:  Head: Normocephalic and atraumatic.  Right Ear: External ear normal.  Left Ear: External ear normal.  Nose: Nose normal.  Mouth/Throat: Oropharynx is clear and moist.  TMs and canals are clear.   Eyes: Conjunctivae and EOM are normal. Pupils are  equal, round, and reactive to light.  Neck: Neck supple. No thyromegaly present.  Mild bilat ant cerv LN.   Cardiovascular: Normal rate, regular rhythm and normal heart sounds.   Pulmonary/Chest: Effort normal and breath sounds normal. She has no wheezes.  Lymphadenopathy:    She has cervical adenopathy.  Neurological: She is alert and oriented to person, place, and time.  Skin: Skin is warm and dry.  Psychiatric: She has a normal mood and affect.          Assessment & Plan:  Atypical chest pain-EKG today shows normal sinus rhythm with normal axis and no acute ST-T wave changes. May still be viral illness but will workup for other causes. We'll check CK and troponin for her neck issues. We'll also check thyroid. Evaluate for anemia. We'll do a chest x-ray as well. Consider pulmonary  embolism will do stat d-dimer as well. Call with results once available. In the meantime continue symptom Medicare. Her blood pressure was elevated today which is unusual for her. She has not been taking any decongestants.

## 2014-06-26 NOTE — Addendum Note (Signed)
Addended by: Chalmers CaterUTTLE, Kebin Maye H on: 06/26/2014 10:53 AM   Modules accepted: Orders

## 2014-10-30 ENCOUNTER — Other Ambulatory Visit: Payer: Self-pay | Admitting: Family Medicine

## 2014-10-30 DIAGNOSIS — Z1231 Encounter for screening mammogram for malignant neoplasm of breast: Secondary | ICD-10-CM

## 2014-12-04 ENCOUNTER — Ambulatory Visit (INDEPENDENT_AMBULATORY_CARE_PROVIDER_SITE_OTHER): Payer: BLUE CROSS/BLUE SHIELD

## 2014-12-04 DIAGNOSIS — Z1231 Encounter for screening mammogram for malignant neoplasm of breast: Secondary | ICD-10-CM

## 2014-12-11 ENCOUNTER — Encounter: Payer: Self-pay | Admitting: Family Medicine

## 2014-12-11 ENCOUNTER — Other Ambulatory Visit (HOSPITAL_COMMUNITY)
Admission: RE | Admit: 2014-12-11 | Discharge: 2014-12-11 | Disposition: A | Discharge status: Home / Self Care | Payer: BLUE CROSS/BLUE SHIELD | Source: Ambulatory Visit | Attending: Family Medicine | Admitting: Family Medicine

## 2014-12-11 ENCOUNTER — Ambulatory Visit (INDEPENDENT_AMBULATORY_CARE_PROVIDER_SITE_OTHER): Payer: BLUE CROSS/BLUE SHIELD

## 2014-12-11 ENCOUNTER — Ambulatory Visit (INDEPENDENT_AMBULATORY_CARE_PROVIDER_SITE_OTHER): Payer: BLUE CROSS/BLUE SHIELD | Admitting: Family Medicine

## 2014-12-11 VITALS — BP 107/64 | HR 77 | Temp 98.7°F | Wt 158.0 lb

## 2014-12-11 DIAGNOSIS — Z01419 Encounter for gynecological examination (general) (routine) without abnormal findings: Secondary | ICD-10-CM | POA: Insufficient documentation

## 2014-12-11 DIAGNOSIS — F3281 Premenstrual dysphoric disorder: Secondary | ICD-10-CM

## 2014-12-11 DIAGNOSIS — Z23 Encounter for immunization: Secondary | ICD-10-CM

## 2014-12-11 DIAGNOSIS — E010 Iodine-deficiency related diffuse (endemic) goiter: Secondary | ICD-10-CM | POA: Diagnosis not present

## 2014-12-11 DIAGNOSIS — Z0000 Encounter for general adult medical examination without abnormal findings: Secondary | ICD-10-CM

## 2014-12-11 DIAGNOSIS — Z1151 Encounter for screening for human papillomavirus (HPV): Secondary | ICD-10-CM | POA: Insufficient documentation

## 2014-12-11 DIAGNOSIS — E049 Nontoxic goiter, unspecified: Secondary | ICD-10-CM

## 2014-12-11 DIAGNOSIS — Z114 Encounter for screening for human immunodeficiency virus [HIV]: Secondary | ICD-10-CM

## 2014-12-11 DIAGNOSIS — Z1159 Encounter for screening for other viral diseases: Secondary | ICD-10-CM | POA: Diagnosis not present

## 2014-12-11 MED ORDER — SERTRALINE HCL 25 MG PO TABS: 25.0000 mg | ORAL_TABLET | Freq: Every day | ORAL | Status: DC

## 2014-12-11 NOTE — Progress Notes (Signed)
Subjective:     Jody Wheeler is a 54 y.o. female and is here for a comprehensive physical exam. The patient reports problems - she started her zoloft again. LMP was 11/27/14.  Had been more moody and irritable latey.  Dong well back on her zoloft. Would like to continue it.  Here symptoms are particulalry worse around her menstrual cycle.   Social History   Social History  . Marital Status: Single    Spouse Name: N/A  . Number of Children: 0  . 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 Smoking status: Former Smoker    Quit date: 11/17/2005  . Smokeless tobacco: Not on file  . Alcohol Use: 2.0 oz/week    4 drink(s) per week  . Drug Use: No  . Sexual Activity:    Partners: Male   Other Topics Concern  . Not on file   Social History Narrative   No regular exercise.  3 caffeine drinks per day.    Health Maintenance  Topic Date Due  . Hepatitis C Screening  [birth year]  . HIV Screening  05/14/1976  . PAP SMEAR  03/01/2014  . INFLUENZA VACCINE  09/22/2014  . TETANUS/TDAP  02/22/2015  . MAMMOGRAM  12/03/2016  . COLONOSCOPY  12/12/2018    The following portions of the patient's history were reviewed and updated as appropriate: allergies, current medications, past family history, past medical history, past social history, past surgical history and problem list.  Review of Systems Pertinent items noted in HPI and remainder of comprehensive ROS otherwise negative.   Objective:    BP 107/64 mmHg  Pulse 77  Temp(Src) 98.7 F (37.1 C)  Wt 158 lb (71.668 kg) General appearance: alert, cooperative and appears stated age Head: Normocephalic, without obvious abnormality, atraumatic Eyes: conj clear, EOMI, pEERLA Ears: normal TM's and external ear canals both ears Nose: Nares normal. Septum midline. Mucosa normal. No drainage or sinus tenderness. Throat: lips, mucosa, and tongue normal; teeth and gums  normal Neck: no adenopathy, no carotid bruit, no JVD, supple, symmetrical, trachea midline and thyroid: enlarged and mildly enlarge. no palpable nodule.  No asymmetry Back: symmetric, no curvature. ROM normal. No CVA tenderness. Lungs: clear to auscultation bilaterally Breasts: normal appearance, no masses or tenderness Heart: regular rate and rhythm, S1, S2 normal, no murmur, click, rub or gallop Abdomen: soft, non-tender; bowel sounds normal; no masses,  no organomegaly Pelvic: cervix normal in appearance, external genitalia normal, no adnexal masses or tenderness, no cervical motion tenderness, rectovaginal septum normal, uterus normal size, shape, and consistency and vagina normal without discharge Extremities: extremities normal, atraumatic, no cyanosis or edema Pulses: 2+ and symmetric Skin: Skin color, texture, turgor normal. No rashes or lesions Lymph nodes: Cervical, supraclavicular, and axillary nodes normal. Neurologic: Alert and oriented X 3, normal strength and tone. Normal symmetric reflexes. Normal coordination and gait    Assessment:    Healthy female exam.     Plan:     See After Visit Summary for Counseling Recommendations  Keep up a regular exercise program and make sure you are eating a healthy diet Try to eat 4 servings of dairy a day, or if you are lactose intolerant take a calcium with vitamin D daily.  Your vaccines are up to date.   Borderline thyromegaly. Will schedule for an [place]. Last TSH was normal  Lab Results  Component Value Date  TSH 1.156 06/17/2014   PMDD - continue sertraline. F/U in 1 year.

## 2014-12-11 NOTE — Patient Instructions (Signed)
Keep up a regular exercise program and make sure you are eating a healthy diet Try to eat 4 servings of dairy a day, or if you are lactose intolerant take a calcium with vitamin D daily.  Your vaccines are up to date.   

## 2014-12-15 LAB — CYTOLOGY - PAP

## 2014-12-18 LAB — HEPATIC FUNCTION PANEL
ALT: 18 U/L (ref 7–35)
AST: 21 U/L (ref 13–35)
Alkaline Phosphatase: 56 U/L (ref 25–125)
Bilirubin, Total: 0.3 mg/dL

## 2014-12-18 LAB — CBC AND DIFFERENTIAL
HCT: 39 % (ref 36–46)
HEMOGLOBIN: 13.6 g/dL (ref 12.0–16.0)
Neutrophils Absolute: 3 /uL
WBC: 4.7 10^3/mL

## 2014-12-18 LAB — BASIC METABOLIC PANEL
BUN: 18 mg/dL (ref 4–21)
Creatinine: 0.9 mg/dL (ref 0.5–1.1)
GLUCOSE: 100 mg/dL
POTASSIUM: 4.9 mmol/L (ref 3.4–5.3)
SODIUM: 135 mmol/L — AB (ref 137–147)

## 2014-12-18 LAB — LIPID PANEL
CHOLESTEROL: 245 mg/dL — AB (ref 0–200)
HDL: 69 mg/dL (ref 35–70)
LDL Cholesterol: 157 mg/dL
LDl/HDL Ratio: 3.6
Triglycerides: 97 mg/dL (ref 40–160)

## 2014-12-18 LAB — TSH: TSH: 1.32 u[IU]/mL (ref 0.41–5.90)

## 2014-12-18 LAB — THYROID PANEL
Free Thyroxine: 2.3
T3 Uptake: 30
T4 TOTAL: 7.6

## 2015-01-08 ENCOUNTER — Other Ambulatory Visit: Payer: Self-pay | Admitting: *Deleted

## 2015-01-08 MED ORDER — SERTRALINE HCL 25 MG PO TABS: 25.0000 mg | ORAL_TABLET | Freq: Every day | ORAL | Status: DC

## 2015-02-02 ENCOUNTER — Telehealth: Payer: Self-pay | Admitting: Family Medicine

## 2015-02-02 NOTE — Telephone Encounter (Signed)
Spoke with patient regarding lab results.  Said she had tried oatmeal before and it lower cholesterol.  She is going to try oatmeal again.

## 2015-02-02 NOTE — Telephone Encounter (Signed)
Call pt: sodium was low by one point. OK. Total cholesterol and LDL are high.  Work on [employer] diet and getting exercise and check again in one year.  Thyroid levels look great.  Blood count is normal. No sign of anemia or infection.

## 2015-02-02 NOTE — Telephone Encounter (Signed)
Left message for patient to call back  

## 2015-10-24 ENCOUNTER — Other Ambulatory Visit: Payer: Self-pay | Admitting: Family Medicine

## 2015-11-09 ENCOUNTER — Other Ambulatory Visit: Payer: Self-pay | Admitting: Family Medicine

## 2015-11-09 DIAGNOSIS — Z1231 Encounter for screening mammogram for malignant neoplasm of breast: Secondary | ICD-10-CM

## 2015-12-11 ENCOUNTER — Ambulatory Visit (INDEPENDENT_AMBULATORY_CARE_PROVIDER_SITE_OTHER): Payer: BLUE CROSS/BLUE SHIELD | Admitting: Family Medicine

## 2015-12-11 ENCOUNTER — Encounter: Payer: Self-pay | Admitting: Family Medicine

## 2015-12-11 VITALS — BP 101/66 | HR 76 | Ht 68.0 in | Wt 164.0 lb

## 2015-12-11 DIAGNOSIS — Z23 Encounter for immunization: Secondary | ICD-10-CM

## 2015-12-11 DIAGNOSIS — Z0000 Encounter for general adult medical examination without abnormal findings: Secondary | ICD-10-CM

## 2015-12-11 NOTE — Patient Instructions (Signed)
Preventive Care for Adults, Female A healthy lifestyle and preventive care can promote health and wellness. Preventive health guidelines for women include the following key practices.  A routine yearly physical is a good way to check with your health care provider about your health and preventive screening. It is a chance to share any concerns and updates on your health and to receive a thorough exam.  Visit your dentist for a routine exam and preventive care every 6 months. Brush your teeth twice a day and floss once a day. Good oral hygiene prevents tooth decay and gum disease.  The frequency of eye exams is based on your age, health, family medical history, use of contact lenses, and other factors. Follow your health care provider's recommendations for frequency of eye exams.  Eat a healthy diet. Foods like vegetables, fruits, whole grains, low-fat dairy products, and lean protein foods contain the nutrients you need without too many calories. Decrease your intake of foods high in solid fats, added sugars, and salt. Eat the right amount of calories for you.Get information about a proper diet from your health care provider, if necessary.  Regular physical exercise is one of the most important things you can do for your health. Most adults should get at least 150 minutes of moderate-intensity exercise (any activity that increases your heart rate and causes you to sweat) each week. In addition, most adults need muscle-strengthening exercises on 2 or more days a week.  Maintain a healthy weight. The body mass index (BMI) is a screening tool to identify possible weight problems. It provides an estimate of body fat based on height and weight. Your health care provider can find your BMI and can help you achieve or maintain a healthy weight.For adults 20 years and older:  A BMI below 18.5 is considered underweight.  A BMI of 18.5 to 24.9 is normal.  A BMI of 25 to 29.9 is considered  overweight.  A BMI of 30 and above is considered obese.  Maintain normal blood lipids and cholesterol levels by exercising and minimizing your intake of saturated fat. Eat a balanced diet with plenty of fruit and vegetables. Blood tests for lipids and cholesterol should begin at age 48 and be repeated every 5 years. If your lipid or cholesterol levels are high, you are over 50, or you are at high risk for heart disease, you may need your cholesterol levels checked more frequently.Ongoing high lipid and cholesterol levels should be treated with medicines if diet and exercise are not working.  If you smoke, find out from your health care provider how to quit. If you do not use tobacco, do not start.  Lung cancer screening is recommended for adults aged 76-80 years who are at high risk for developing lung cancer because of a history of smoking. A yearly low-dose CT scan of the lungs is recommended for people who have at least a 30-pack-year history of smoking and are a current smoker or have quit within the past 15 years. A pack year of smoking is smoking an average of 1 pack of cigarettes a day for 1 year (for example: 1 pack a day for 30 years or 2 packs a day for 15 years). Yearly screening should continue until the smoker has stopped smoking for at least 15 years. Yearly screening should be stopped for people who develop a health problem that would prevent them from having lung cancer treatment.  If you are pregnant, do not drink alcohol. If you are  breastfeeding, be very cautious about drinking alcohol. If you are not pregnant and choose to drink alcohol, do not have more than 1 drink per day. One drink is considered to be 12 ounces (355 mL) of beer, 5 ounces (148 mL) of wine, or 1.5 ounces (44 mL) of liquor.  Avoid use of street drugs. Do not share needles with anyone. Ask for help if you need support or instructions about stopping the use of drugs.  High blood pressure causes heart disease and  increases the risk of stroke. Your blood pressure should be checked at least every 1 to 2 years. Ongoing high blood pressure should be treated with medicines if weight loss and exercise do not work.  If you are 66-42 years old, ask your health care provider if you should take aspirin to prevent strokes.  Diabetes screening is done by taking a blood sample to check your blood glucose level after you have not eaten for a certain period of time (fasting). If you are not overweight and you do not have risk factors for diabetes, you should be screened once every 3 years starting at age 40. If you are overweight or obese and you are 24-8 years of age, you should be screened for diabetes every year as part of your cardiovascular risk assessment.  Breast cancer screening is essential preventive care for women. You should practice "breast self-awareness." This means understanding the normal appearance and feel of your breasts and may include breast self-examination. Any changes detected, no matter how small, should be reported to a health care provider. Women in their 51s and 30s should have a clinical breast exam (CBE) by a health care provider as part of a regular health exam every 1 to 3 years. After age 14, women should have a CBE every year. Starting at age 72, women should consider having a mammogram (breast X-ray test) every year. Women who have a family history of breast cancer should talk to their health care provider about genetic screening. Women at a high risk of breast cancer should talk to their health care providers about having an MRI and a mammogram every year.  Breast cancer gene (BRCA)-related cancer risk assessment is recommended for women who have family members with BRCA-related cancers. BRCA-related cancers include breast, ovarian, tubal, and peritoneal cancers. Having family members with these cancers may be associated with an increased risk for harmful changes (mutations) in the breast  cancer genes BRCA1 and BRCA2. Results of the assessment will determine the need for genetic counseling and BRCA1 and BRCA2 testing.  Your health care provider may recommend that you be screened regularly for cancer of the pelvic organs (ovaries, uterus, and vagina). This screening involves a pelvic examination, including checking for microscopic changes to the surface of your cervix (Pap test). You may be encouraged to have this screening done every 3 years, beginning at age 26.  For women ages 28-65, health care providers may recommend pelvic exams and Pap testing every 3 years, or they may recommend the Pap and pelvic exam, combined with testing for human papilloma virus (HPV), every 5 years. Some types of HPV increase your risk of cervical cancer. Testing for HPV may also be done on women of any age with unclear Pap test results.  Other health care providers may not recommend any screening for nonpregnant women who are considered low risk for pelvic cancer and who do not have symptoms. Ask your health care provider if a screening pelvic exam is right for  you.  If you have had past treatment for cervical cancer or a condition that could lead to cancer, you need Pap tests and screening for cancer for at least 20 years after your treatment. If Pap tests have been discontinued, your risk factors (such as having a new sexual partner) need to be reassessed to determine if screening should resume. Some women have medical problems that increase the chance of getting cervical cancer. In these cases, your health care provider may recommend more frequent screening and Pap tests.  Colorectal cancer can be detected and often prevented. Most routine colorectal cancer screening begins at the age of 50 years and continues through age 75 years. However, your health care provider may recommend screening at an earlier age if you have risk factors for colon cancer. On a yearly basis, your health care provider may provide  home test kits to check for hidden blood in the stool. Use of a small camera at the end of a tube, to directly examine the colon (sigmoidoscopy or colonoscopy), can detect the earliest forms of colorectal cancer. Talk to your health care provider about this at age 50, when routine screening begins. Direct exam of the colon should be repeated every 5-10 years through age 75 years, unless early forms of precancerous polyps or small growths are found.  People who are at an increased risk for hepatitis B should be screened for this virus. You are considered at high risk for hepatitis B if:  You were born in a country where hepatitis B occurs often. Talk with your health care provider about which countries are considered high risk.  Your parents were born in a high-risk country and you have not received a shot to protect against hepatitis B (hepatitis B vaccine).  You have HIV or AIDS.  You use needles to inject street drugs.  You live with, or have sex with, someone who has hepatitis B.  You get hemodialysis treatment.  You take certain medicines for conditions like cancer, organ transplantation, and autoimmune conditions.  Hepatitis C blood testing is recommended for all people born from 1945 through 1965 and any individual with known risks for hepatitis C.  Practice safe sex. Use condoms and avoid high-risk sexual practices to reduce the spread of sexually transmitted infections (STIs). STIs include gonorrhea, chlamydia, syphilis, trichomonas, herpes, HPV, and human immunodeficiency virus (HIV). Herpes, HIV, and HPV are viral illnesses that have no cure. They can result in disability, cancer, and death.  You should be screened for sexually transmitted illnesses (STIs) including gonorrhea and chlamydia if:  You are sexually active and are younger than 24 years.  You are older than 24 years and your health care provider tells you that you are at risk for this type of infection.  Your sexual  activity has changed since you were last screened and you are at an increased risk for chlamydia or gonorrhea. Ask your health care provider if you are at risk.  If you are at risk of being infected with HIV, it is recommended that you take a prescription medicine daily to prevent HIV infection. This is called preexposure prophylaxis (PrEP). You are considered at risk if:  You are sexually active and do not regularly use condoms or know the HIV status of your partner(s).  You take drugs by injection.  You are sexually active with a partner who has HIV.  Talk with your health care provider about whether you are at high risk of being infected with HIV. If   you choose to begin PrEP, you should first be tested for HIV. You should then be tested every 3 months for as long as you are taking PrEP.  Osteoporosis is a disease in which the bones lose minerals and strength with aging. This can result in serious bone fractures or breaks. The risk of osteoporosis can be identified using a bone density scan. Women ages 1 years and over and women at risk for fractures or osteoporosis should discuss screening with their health care providers. Ask your health care provider whether you should take a calcium supplement or vitamin D to reduce the rate of osteoporosis.  Menopause can be associated with physical symptoms and risks. Hormone replacement therapy is available to decrease symptoms and risks. You should talk to your health care provider about whether hormone replacement therapy is right for you.  Use sunscreen. Apply sunscreen liberally and repeatedly throughout the day. You should seek shade when your shadow is shorter than you. Protect yourself by wearing long sleeves, pants, a wide-brimmed hat, and sunglasses year round, whenever you are outdoors.  Once a month, do a whole body skin exam, using a mirror to look at the skin on your back. Tell your health care provider of new moles, moles that have irregular  borders, moles that are larger than a pencil eraser, or moles that have changed in shape or color.  Stay current with required vaccines (immunizations).  Influenza vaccine. All adults should be immunized every year.  Tetanus, diphtheria, and acellular pertussis (Td, Tdap) vaccine. Pregnant women should receive 1 dose of Tdap vaccine during each pregnancy. The dose should be obtained regardless of the length of time since the last dose. Immunization is preferred during the 27th-36th week of gestation. An adult who has not previously received Tdap or who does not know her vaccine status should receive 1 dose of Tdap. This initial dose should be followed by tetanus and diphtheria toxoids (Td) booster doses every 10 years. Adults with an unknown or incomplete history of completing a 3-dose immunization series with Td-containing vaccines should begin or complete a primary immunization series including a Tdap dose. Adults should receive a Td booster every 10 years.  Varicella vaccine. An adult without evidence of immunity to varicella should receive 2 doses or a second dose if she has previously received 1 dose. Pregnant females who do not have evidence of immunity should receive the first dose after pregnancy. This first dose should be obtained before leaving the health care facility. The second dose should be obtained 4-8 weeks after the first dose.  Human papillomavirus (HPV) vaccine. Females aged 13-26 years who have not received the vaccine previously should obtain the 3-dose series. The vaccine is not recommended for use in pregnant females. However, pregnancy testing is not needed before receiving a dose. If a female is found to be pregnant after receiving a dose, no treatment is needed. In that case, the remaining doses should be delayed until after the pregnancy. Immunization is recommended for any person with an immunocompromised condition through the age of 24 years if she did not get any or all doses  earlier. During the 3-dose series, the second dose should be obtained 4-8 weeks after the first dose. The third dose should be obtained 24 weeks after the first dose and 16 weeks after the second dose.  Zoster vaccine. One dose is recommended for adults aged 97 years or older unless certain conditions are present.  Measles, mumps, and rubella (MMR) vaccine. Adults born  before 1957 generally are considered immune to measles and mumps. Adults born in [birth year] or later should have 1 or more doses of MMR vaccine unless there is a contraindication to the vaccine or there is laboratory evidence of immunity to each of the three diseases. A routine second dose of MMR vaccine should be obtained at least 28 days after the first dose for students attending postsecondary schools, health care workers, or international travelers. People who received inactivated measles vaccine or an unknown type of measles vaccine during 1963-1967 should receive 2 doses of MMR vaccine. People who received inactivated mumps vaccine or an unknown type of mumps vaccine before 1979 and are at high risk for mumps infection should consider immunization with 2 doses of MMR vaccine. For females of childbearing age, rubella immunity should be determined. If there is no evidence of immunity, females who are not pregnant should be vaccinated. If there is no evidence of immunity, females who are pregnant should delay immunization until after pregnancy. Unvaccinated health care workers born before 60 who lack laboratory evidence of measles, mumps, or rubella immunity or laboratory confirmation of disease should consider measles and mumps immunization with 2 doses of MMR vaccine or rubella immunization with 1 dose of MMR vaccine.  Pneumococcal 13-valent conjugate (PCV13) vaccine. When indicated, a person who is uncertain of his immunization history and has no record of immunization should receive the PCV13 vaccine. All adults 61 years of age and older  should receive this vaccine. An adult aged 92 years or older who has certain medical conditions and has not been previously immunized should receive 1 dose of PCV13 vaccine. This PCV13 should be followed with a dose of pneumococcal polysaccharide (PPSV23) vaccine. Adults who are at high risk for pneumococcal disease should obtain the PPSV23 vaccine at least 8 weeks after the dose of PCV13 vaccine. Adults older than 54 years of age who have normal immune system function should obtain the PPSV23 vaccine dose at least 1 year after the dose of PCV13 vaccine.  Pneumococcal polysaccharide (PPSV23) vaccine. When PCV13 is also indicated, PCV13 should be obtained first. All adults aged 2 years and older should be immunized. An adult younger than age 30 years who has certain medical conditions should be immunized. Any person who resides in a nursing home or long-term care facility should be immunized. An adult smoker should be immunized. People with an immunocompromised condition and certain other conditions should receive both PCV13 and PPSV23 vaccines. People with human immunodeficiency virus (HIV) infection should be immunized as soon as possible after diagnosis. Immunization during chemotherapy or radiation therapy should be avoided. Routine use of PPSV23 vaccine is not recommended for American Indians, [place] Natives, or people younger than 65 years unless there are medical conditions that require PPSV23 vaccine. When indicated, people who have unknown immunization and have no record of immunization should receive PPSV23 vaccine. One-time revaccination 5 years after the first dose of PPSV23 is recommended for people aged 19-64 years who have chronic kidney failure, nephrotic syndrome, asplenia, or immunocompromised conditions. People who received 1-2 doses of PPSV23 before age 44 years should receive another dose of PPSV23 vaccine at age 83 years or later if at least 5 years have passed since the previous dose. Doses  of PPSV23 are not needed for people immunized with PPSV23 at or after age 20 years.  Meningococcal vaccine. Adults with asplenia or persistent complement component deficiencies should receive 2 doses of quadrivalent meningococcal conjugate (MenACWY-D) vaccine. The doses should be obtained  at least 2 months apart. Microbiologists working with certain meningococcal bacteria, [employer] recruits, people at risk during an outbreak, and people who travel to or live in countries with a high rate of meningitis should be immunized. A first-year college student up through age 23 years who is living in a residence hall should receive a dose if she did not receive a dose on or after her 16th birthday. Adults who have certain high-risk conditions should receive one or more doses of vaccine.  Hepatitis A vaccine. Adults who wish to be protected from this disease, have certain high-risk conditions, work with hepatitis A-infected animals, work in hepatitis A research labs, or travel to or work in countries with a high rate of hepatitis A should be immunized. Adults who were previously unvaccinated and who anticipate close contact with an international adoptee during the first 60 days after arrival in the [place] from a country with a high rate of hepatitis A should be immunized.  Hepatitis B vaccine. Adults who wish to be protected from this disease, have certain high-risk conditions, may be exposed to blood or other infectious body fluids, are household contacts or sex partners of hepatitis B positive people, are clients or workers in certain care facilities, or travel to or work in countries with a high rate of hepatitis B should be immunized.  Haemophilus influenzae type b (Hib) vaccine. A previously unvaccinated person with asplenia or sickle cell disease or having a scheduled splenectomy should receive 1 dose of Hib vaccine. Regardless of previous immunization, a recipient of a hematopoietic stem cell transplant  should receive a 3-dose series 6-12 months after her successful transplant. Hib vaccine is not recommended for adults with HIV infection. Preventive Services / Frequency Ages 46 to 33 years  Blood pressure check.** / Every 3-5 years.  Lipid and cholesterol check.** / Every 5 years beginning at age 87.  Clinical breast exam.** / Every 3 years for women in their 3s and 3s.  BRCA-related cancer risk assessment.** / For women who have family members with a BRCA-related cancer (breast, ovarian, tubal, or peritoneal cancers).  Pap test.** / Every 2 years from ages 46 through 52. Every 3 years starting at age 64 through age 32 or 61 with a history of 3 consecutive normal Pap tests.  HPV screening.** / Every 3 years from ages 40 through ages 25 to 22 with a history of 3 consecutive normal Pap tests.  Hepatitis C blood test.** / For any individual with known risks for hepatitis C.  Skin self-exam. / Monthly.  Influenza vaccine. / Every year.  Tetanus, diphtheria, and acellular pertussis (Tdap, Td) vaccine.** / Consult your health care provider. Pregnant women should receive 1 dose of Tdap vaccine during each pregnancy. 1 dose of Td every 10 years.  Varicella vaccine.** / Consult your health care provider. Pregnant females who do not have evidence of immunity should receive the first dose after pregnancy.  HPV vaccine. / 3 doses over 6 months, if 64 and younger. The vaccine is not recommended for use in pregnant females. However, pregnancy testing is not needed before receiving a dose.  Measles, mumps, rubella (MMR) vaccine.** / You need at least 1 dose of MMR if you were born in 1957 or later. You may also need a 2nd dose. For females of childbearing age, rubella immunity should be determined. If there is no evidence of immunity, females who are not pregnant should be vaccinated. If there is no evidence of immunity, females who are  pregnant should delay immunization until after  pregnancy.  Pneumococcal 13-valent conjugate (PCV13) vaccine.** / Consult your health care provider.  Pneumococcal polysaccharide (PPSV23) vaccine.** / 1 to 2 doses if you smoke cigarettes or if you have certain conditions.  Meningococcal vaccine.** / 1 dose if you are age 87 to 44 years and a Market researcher living in a residence hall, or have one of several medical conditions, you need to get vaccinated against meningococcal disease. You may also need additional booster doses.  Hepatitis A vaccine.** / Consult your health care provider.  Hepatitis B vaccine.** / Consult your health care provider.  Haemophilus influenzae type b (Hib) vaccine.** / Consult your health care provider. Ages 86 to 38 years  Blood pressure check.** / Every year.  Lipid and cholesterol check.** / Every 5 years beginning at age 49 years.  Lung cancer screening. / Every year if you are aged 71-80 years and have a 30-pack-year history of smoking and currently smoke or have quit within the past 15 years. Yearly screening is stopped once you have quit smoking for at least 15 years or develop a health problem that would prevent you from having lung cancer treatment.  Clinical breast exam.** / Every year after age 51 years.  BRCA-related cancer risk assessment.** / For women who have family members with a BRCA-related cancer (breast, ovarian, tubal, or peritoneal cancers).  Mammogram.** / Every year beginning at age 18 years and continuing for as long as you are in good health. Consult with your health care provider.  Pap test.** / Every 3 years starting at age 63 years through age 37 or 57 years with a history of 3 consecutive normal Pap tests.  HPV screening.** / Every 3 years from ages 41 years through ages 76 to 23 years with a history of 3 consecutive normal Pap tests.  Fecal occult blood test (FOBT) of stool. / Every year beginning at age 36 years and continuing until age 51 years. You may not need  to do this test if you get a colonoscopy every 10 years.  Flexible sigmoidoscopy or colonoscopy.** / Every 5 years for a flexible sigmoidoscopy or every 10 years for a colonoscopy beginning at age 36 years and continuing until age 35 years.  Hepatitis C blood test.** / For all people born from [birth year] through 1964 and any individual with known risks for hepatitis C.  Skin self-exam. / Monthly.  Influenza vaccine. / Every year.  Tetanus, diphtheria, and acellular pertussis (Tdap/Td) vaccine.** / Consult your health care provider. Pregnant women should receive 1 dose of Tdap vaccine during each pregnancy. 1 dose of Td every 10 years.  Varicella vaccine.** / Consult your health care provider. Pregnant females who do not have evidence of immunity should receive the first dose after pregnancy.  Zoster vaccine.** / 1 dose for adults aged 73 years or older.  Measles, mumps, rubella (MMR) vaccine.** / You need at least 1 dose of MMR if you were born in 1957 or later. You may also need a second dose. For females of childbearing age, rubella immunity should be determined. If there is no evidence of immunity, females who are not pregnant should be vaccinated. If there is no evidence of immunity, females who are pregnant should delay immunization until after pregnancy.  Pneumococcal 13-valent conjugate (PCV13) vaccine.** / Consult your health care provider.  Pneumococcal polysaccharide (PPSV23) vaccine.** / 1 to 2 doses if you smoke cigarettes or if you have certain conditions.  Meningococcal vaccine.** /  Consult your health care provider.  Hepatitis A vaccine.** / Consult your health care provider.  Hepatitis B vaccine.** / Consult your health care provider.  Haemophilus influenzae type b (Hib) vaccine.** / Consult your health care provider. Ages 80 years and over  Blood pressure check.** / Every year.  Lipid and cholesterol check.** / Every 5 years beginning at age 62 years.  Lung cancer  screening. / Every year if you are aged 32-80 years and have a 30-pack-year history of smoking and currently smoke or have quit within the past 15 years. Yearly screening is stopped once you have quit smoking for at least 15 years or develop a health problem that would prevent you from having lung cancer treatment.  Clinical breast exam.** / Every year after age 61 years.  BRCA-related cancer risk assessment.** / For women who have family members with a BRCA-related cancer (breast, ovarian, tubal, or peritoneal cancers).  Mammogram.** / Every year beginning at age 39 years and continuing for as long as you are in good health. Consult with your health care provider.  Pap test.** / Every 3 years starting at age 85 years through age 74 or 72 years with 3 consecutive normal Pap tests. Testing can be stopped between 65 and 70 years with 3 consecutive normal Pap tests and no abnormal Pap or HPV tests in the past 10 years.  HPV screening.** / Every 3 years from ages 55 years through ages 67 or 77 years with a history of 3 consecutive normal Pap tests. Testing can be stopped between 65 and 70 years with 3 consecutive normal Pap tests and no abnormal Pap or HPV tests in the past 10 years.  Fecal occult blood test (FOBT) of stool. / Every year beginning at age 81 years and continuing until age 22 years. You may not need to do this test if you get a colonoscopy every 10 years.  Flexible sigmoidoscopy or colonoscopy.** / Every 5 years for a flexible sigmoidoscopy or every 10 years for a colonoscopy beginning at age 67 years and continuing until age 22 years.  Hepatitis C blood test.** / For all people born from [birth year] through 1964 and any individual with known risks for hepatitis C.  Osteoporosis screening.** / A one-time screening for women ages 8 years and over and women at risk for fractures or osteoporosis.  Skin self-exam. / Monthly.  Influenza vaccine. / Every year.  Tetanus, diphtheria, and  acellular pertussis (Tdap/Td) vaccine.** / 1 dose of Td every 10 years.  Varicella vaccine.** / Consult your health care provider.  Zoster vaccine.** / 1 dose for adults aged 56 years or older.  Pneumococcal 13-valent conjugate (PCV13) vaccine.** / Consult your health care provider.  Pneumococcal polysaccharide (PPSV23) vaccine.** / 1 dose for all adults aged 15 years and older.  Meningococcal vaccine.** / Consult your health care provider.  Hepatitis A vaccine.** / Consult your health care provider.  Hepatitis B vaccine.** / Consult your health care provider.  Haemophilus influenzae type b (Hib) vaccine.** / Consult your health care provider. ** Family history and personal history of risk and conditions may change your health care provider's recommendations.   This information is not intended to replace advice given to you by your health care provider. Make sure you discuss any questions you have with your health care provider.   Document Released: 04/05/2001 Document Revised: 02/28/2014 Document Reviewed: 07/05/2010 Elsevier Interactive Patient Education [employer].

## 2015-12-11 NOTE — Progress Notes (Signed)
Subjective:     Jody Wheeler is a 55 y.o. female and is here for a comprehensive physical exam. The patient reports no problems.  Social History   Social History  . Marital status: Single    Spouse name: N/A  . Number of children: 0  . Years of education: Assoc deg   Occupational History  . Customer Service       [employer].    Social History Main Topics  . Smoking status: Former Smoker    Quit date: 11/17/2005  . Smokeless tobacco: Not on file  . Alcohol use 2.0 oz/week    4 drink(s) per week  . Drug use: No  . Sexual activity: Yes    Partners: Male   Other Topics Concern  . Not on file   Social History Narrative   Some regular exercise. Mostly walks.   3 caffeine drinks per day.    Health Maintenance  Topic Date Due  . Hepatitis C Screening  Jul 09, 1960  . HIV Screening  05/14/1976  . TETANUS/TDAP  02/22/2015  . INFLUENZA VACCINE  09/22/2015  . MAMMOGRAM  12/03/2016  . PAP SMEAR  12/10/2017  . COLONOSCOPY  12/12/2018    The following portions of the patient's history were reviewed and updated as appropriate: allergies, current medications, past family history, past medical history, past social history, past surgical history and problem list.  Review of Systems A comprehensive review of systems was negative.   Objective:    BP 101/66   Pulse 76   Ht 5\' 8"  (1.727 m)   Wt 164 lb (74.4 kg)   BMI 24.94 kg/m  General appearance: alert, cooperative and appears stated age Head: Normocephalic, without obvious abnormality, atraumatic Eyes: conj clear, EOMi PEERLA Ears: normal TM's and external ear canals both ears Nose: Nares normal. Septum midline. Mucosa normal. No drainage or sinus tenderness. Throat: lips, mucosa, and tongue normal; teeth and gums normal Neck: no adenopathy, no carotid bruit, no JVD, supple, symmetrical, trachea midline and thyroid not enlarged, symmetric, no tenderness/mass/nodules Back: symmetric, no curvature. ROM normal.  No CVA tenderness. Lungs: clear to auscultation bilaterally Breasts: normal appearance, no masses or tenderness Heart: regular rate and rhythm, S1, S2 normal, no murmur, click, rub or gallop Abdomen: soft, non-tender; bowel sounds normal; no masses,  no organomegaly Extremities: extremities normal, atraumatic, no cyanosis or edema Pulses: 2+ and symmetric Skin: Skin color, texture, turgor normal. No rashes or lesions Lymph nodes: Cervical, supraclavicular, and axillary nodes normal. Neurologic: Alert and oriented X 3, normal strength and tone. Normal symmetric reflexes. Normal coordination and gait    Assessment:    Healthy female exam.      Plan:     See After Visit Summary for Counseling Recommendations   Keep up a regular exercise program and make sure you are eating a healthy diet Try to eat 4 servings of dairy a day, or if you are lactose intolerant take a calcium with vitamin D daily.  Your vaccines are up to date.  Her mammogram is scheduled for next Friday.

## 2015-12-18 ENCOUNTER — Ambulatory Visit (INDEPENDENT_AMBULATORY_CARE_PROVIDER_SITE_OTHER): Payer: BLUE CROSS/BLUE SHIELD

## 2015-12-18 DIAGNOSIS — Z1231 Encounter for screening mammogram for malignant neoplasm of breast: Secondary | ICD-10-CM

## 2015-12-29 LAB — BASIC METABOLIC PANEL
BUN: 12 mg/dL (ref 4–21)
Creatinine: 0.9 mg/dL (ref 0.5–1.1)
Glucose: 99 mg/dL
Potassium: 4.8 mmol/L (ref 3.4–5.3)
SODIUM: 137 mmol/L (ref 137–147)

## 2015-12-29 LAB — HEPATIC FUNCTION PANEL
ALT: 21 U/L (ref 7–35)
AST: 24 U/L (ref 13–35)
Alkaline Phosphatase: 78 U/L (ref 25–125)
Bilirubin, Total: 0.5 mg/dL

## 2015-12-29 LAB — CBC AND DIFFERENTIAL
HEMATOCRIT: 40 % (ref 36–46)
HEMOGLOBIN: 13.8 g/dL (ref 12.0–16.0)
NEUTROS ABS: 2 /uL
Platelets: 335 10*3/uL (ref 150–399)

## 2015-12-29 LAB — LIPID PANEL
Cholesterol: 212 mg/dL — AB (ref 0–200)
HDL: 64 mg/dL (ref 35–70)
LDL CALC: 132 mg/dL
LDL/HDL RATIO: 2.1
TRIGLYCERIDES: 82 mg/dL (ref 40–160)

## 2015-12-29 LAB — TSH: TSH: 1.53 u[IU]/mL (ref 0.41–5.90)

## 2016-02-29 ENCOUNTER — Telehealth: Payer: Self-pay | Admitting: Family Medicine

## 2016-02-29 NOTE — Telephone Encounter (Signed)
Call patient: Blood sugar looked great. Kidney function and liver are normal. Total cholesterol and LDL are up just mildly. LDL was 132 and total cholesterol was 212. General glycerides and HDL look great. Thyroid level looks perfect at 1.5. And blood cell count is normal.  Jody Gasseratherine Lashun Mccants, MD

## 2016-03-04 NOTE — Telephone Encounter (Signed)
lvm informing pt of results.Jody Wheeler Lynetta  

## 2016-03-15 ENCOUNTER — Ambulatory Visit (INDEPENDENT_AMBULATORY_CARE_PROVIDER_SITE_OTHER): Payer: BLUE CROSS/BLUE SHIELD | Admitting: Family Medicine

## 2016-03-15 ENCOUNTER — Encounter: Payer: Self-pay | Admitting: Family Medicine

## 2016-03-15 VITALS — BP 128/73 | HR 61 | Temp 98.5°F | Ht 68.0 in | Wt 164.0 lb

## 2016-03-15 DIAGNOSIS — J22 Unspecified acute lower respiratory infection: Secondary | ICD-10-CM

## 2016-03-15 MED ORDER — OSELTAMIVIR PHOSPHATE 75 MG PO CAPS: 75.0000 mg | ORAL_CAPSULE | Freq: Two times a day (BID) | ORAL | 0 refills | Status: DC

## 2016-03-15 NOTE — Progress Notes (Signed)
   Subjective:    Patient ID: Jody Wheeler, female    DOB: 09-27-1960, 56 y.o.   MRN: [id number]  HPI  56 year old female comes in today with a history of 1 day of cough that is mostly dry, sore throat. She denies any known fever because she says she has a have a thermometer to check it but she has had some chills and sweats. No nasal congestion but she's had a clear runny nose. She did take Mucinex today. She is concerned because her husband was seen over the weekend at urgent care and was diagnosed with early pneumonia and placed on Levaquin. She says she has not had bronchitis since she was a smoker, which was years ago. She says it feels just like bronchitis would start. She says sometimes it would take a more than a week to get over it.   Review of Systems     Objective:   Physical Exam  Constitutional: She is oriented to person, place, and time. She appears well-developed and well-nourished.  HENT:  Head: Normocephalic and atraumatic.  Right Ear: External ear normal.  Left Ear: External ear normal.  Nose: Nose normal.  Mouth/Throat: Oropharynx is clear and moist.  TMs and canals are clear.   Eyes: Conjunctivae and EOM are normal. Pupils are equal, round, and reactive to light.  Neck: Neck supple. No thyromegaly present.  Cardiovascular: Normal rate, regular rhythm and normal heart sounds.   Pulmonary/Chest: Effort normal and breath sounds normal. She has no wheezes.  Lymphadenopathy:    She has no cervical adenopathy.  Neurological: She is alert and oriented to person, place, and time.  Skin: Skin is warm and dry.  Psychiatric: She has a normal mood and affect.       Assessment & Plan:  Lower respiratory tract infection. Recommend symptomatic care. Explained that it is likely viral. She actually has great clear lungs today. She actually meets all the criteria for flu except for possible fever. Encouraged her to get a thermometer and check her temperature when she's not  feeling well with chills and sweats to see if she may actually be running a fever. If she is and she is within the window to treat with Tamiflu. Gave her printed prescription to fill only if she develops a fever. Worse over the next few days.

## 2016-04-22 ENCOUNTER — Other Ambulatory Visit: Payer: Self-pay | Admitting: Family Medicine

## 2016-05-02 ENCOUNTER — Encounter: Payer: Self-pay | Admitting: Family Medicine
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
## 2016-11-09 ENCOUNTER — Other Ambulatory Visit: Payer: Self-pay | Admitting: Family Medicine

## 2016-11-09 DIAGNOSIS — Z1239 Encounter for other screening for malignant neoplasm of breast: Secondary | ICD-10-CM

## 2016-12-16 ENCOUNTER — Encounter: Payer: Self-pay | Admitting: Family Medicine

## 2016-12-16 ENCOUNTER — Ambulatory Visit (INDEPENDENT_AMBULATORY_CARE_PROVIDER_SITE_OTHER): Payer: BLUE CROSS/BLUE SHIELD | Admitting: Family Medicine

## 2016-12-16 VITALS — BP 113/77 | HR 74 | Ht 68.0 in | Wt 161.0 lb

## 2016-12-16 DIAGNOSIS — Z1159 Encounter for screening for other viral diseases: Secondary | ICD-10-CM

## 2016-12-16 DIAGNOSIS — Z114 Encounter for screening for human immunodeficiency virus [HIV]: Secondary | ICD-10-CM | POA: Diagnosis not present

## 2016-12-16 NOTE — Progress Notes (Signed)
Subjective:     Jody Wheeler is a 56 y.o. female and is here for a comprehensive physical exam. The patient reports no problems.  Social History   Social History  . Marital status: Single    Spouse name: N/A  . Number of children: 0  . Years of education: Assoc deg   Occupational History  . Customer Service       [employer].    Social History Main Topics  . Smoking status: Former Smoker    Quit date: 11/17/2005  . Smokeless tobacco: Never Used  . Alcohol use 2.0 oz/week    4 Standard drinks or equivalent per week  . Drug use: No  . Sexual activity: Yes    Partners: Male   Other Topics Concern  . Not on file   Social History Narrative   Some regular exercise. Mostly walks.   3 caffeine drinks per day.    Health Maintenance  Topic Date Due  . Hepatitis C Screening  1960/06/09  . HIV Screening  05/14/1976  . INFLUENZA VACCINE  09/21/2016  . PAP SMEAR  12/10/2017  . MAMMOGRAM  12/17/2017  . COLONOSCOPY  12/12/2018  . TETANUS/TDAP  12/10/2025    The following portions of the patient's history were reviewed and updated as appropriate: allergies, current medications, past family history, past medical history, past social history, past surgical history and problem list.  Review of Systems A comprehensive review of systems was negative.   Objective:    BP 113/77   Pulse 74   Ht 5\' 8"  (1.727 m)   Wt 161 lb (73 kg)   SpO2 97%   BMI 24.48 kg/m  General appearance: alert, cooperative and appears stated age Head: Normocephalic, without obvious abnormality, atraumatic Eyes: conj clear, EOMi, PEERLA Ears: normal TM's and external ear canals both ears Nose: Nares normal. Septum midline. Mucosa normal. No drainage or sinus tenderness. Throat: lips, mucosa, and tongue normal; teeth and gums normal Neck: no adenopathy, no carotid bruit, no JVD, supple, symmetrical, trachea midline and thyroid not enlarged, symmetric, no tenderness/mass/nodules Back:  symmetric, no curvature. ROM normal. No CVA tenderness. Lungs: clear to auscultation bilaterally Breasts: normal appearance, no masses or tenderness Heart: regular rate and rhythm, S1, S2 normal, no murmur, click, rub or gallop Abdomen: soft, non-tender; bowel sounds normal; no masses,  no organomegaly Extremities: extremities normal, atraumatic, no cyanosis or edema Pulses: 2+ and symmetric Skin: Skin color, texture, turgor normal. No rashes or lesions Lymph nodes: Cervical, supraclavicular, and axillary nodes normal. Neurologic: Alert and oriented X 3, normal strength and tone. Normal symmetric reflexes. Normal coordination and gait    Assessment:    Healthy female exam.     Plan:     See After Visit Summary for Counseling Recommendations   Keep up a regular exercise program and make sure you are eating a healthy diet Try to eat 4 servings of dairy a day, or if you are lactose intolerant take a calcium with vitamin D daily.  Your vaccines are up to date.

## 2016-12-16 NOTE — Patient Instructions (Addendum)

## 2016-12-28 ENCOUNTER — Ambulatory Visit (INDEPENDENT_AMBULATORY_CARE_PROVIDER_SITE_OTHER): Payer: BLUE CROSS/BLUE SHIELD

## 2016-12-28 DIAGNOSIS — Z1239 Encounter for other screening for malignant neoplasm of breast: Secondary | ICD-10-CM

## 2016-12-28 DIAGNOSIS — Z1231 Encounter for screening mammogram for malignant neoplasm of breast: Secondary | ICD-10-CM

## 2017-01-05 LAB — T4: T4 TOTAL: 6.3

## 2017-01-05 LAB — LIPID PANEL
CHOLESTEROL: 230 — AB (ref 0–200)
HDL: 79 — AB (ref 35–70)
LDL CALC: 134
Triglycerides: 83 (ref 40–160)

## 2017-01-05 LAB — BASIC METABOLIC PANEL
BUN: 18 (ref 4–21)
Creatinine: 0.9 (ref 0.5–1.1)
GLUCOSE: 103
POTASSIUM: 4.9 (ref 3.4–5.3)
SODIUM: 141 (ref 137–147)

## 2017-01-05 LAB — HEPATIC FUNCTION PANEL
ALT: 21 (ref 7–35)
AST: 23 (ref 13–35)
Alkaline Phosphatase: 76 (ref 25–125)
Bilirubin, Total: 0.3

## 2017-01-05 LAB — T3 UPTAKE: T3 UPTAKE: 25

## 2017-01-05 LAB — TSH: TSH: 1.27 (ref 0.41–5.90)

## 2017-02-24 ENCOUNTER — Telehealth: Payer: Self-pay | Admitting: Family Medicine

## 2017-02-24 DIAGNOSIS — E785 Hyperlipidemia, unspecified: Secondary | ICD-10-CM | POA: Insufficient documentation

## 2017-02-24 NOTE — Telephone Encounter (Signed)
Please call patient and let her know that I did look over her labs.  Tell her thank you for dropping this off.  Her LDL cholesterol is just mildly elevated at 134.  Goal is less than 100.  Just make sure getting regular exercise and continue with healthy diet.  We will continue to monitor this yearly.  Her thyroid looks great.  Blood count looks great.  No anemia.  And liver and kidney function are normal.

## 2017-02-24 NOTE — Telephone Encounter (Signed)
Pt advised of recommendations.Jody Wheeler  

## 2017-03-06 ENCOUNTER — Encounter: Payer: Self-pay | Admitting: Family Medicine

## 2020-04-24 ENCOUNTER — Telehealth: Payer: Self-pay | Admitting: Family Medicine

## 2020-04-24 NOTE — Telephone Encounter (Signed)
Pt stated she has moved and will be finding a new primary care office. She will get the pap and mammogram done when she establishes a new pcp.

## 2020-04-24 NOTE — Telephone Encounter (Signed)
Please call patient: She is due for Pap smear and mammogram.  Please see if she is going somewhere else for this such as OB/GYN or if she has transferred primary care

## 2020-11-13 ENCOUNTER — Encounter: Payer: Self-pay | Admitting: Gastroenterology

## 2020-12-28 ENCOUNTER — Ambulatory Visit: Payer: BLUE CROSS/BLUE SHIELD | Admitting: Gastroenterology
